# Patient Record
Sex: Female | Born: 1960 | Race: White | Smoking: Current every day smoker
Health system: Southern US, Community
[De-identification: ages and names within clinical notes are randomized; demographics above are authoritative.]

## PROBLEM LIST (undated history)

## (undated) DIAGNOSIS — F99 Mental disorder, not otherwise specified: Secondary | ICD-10-CM

## (undated) DIAGNOSIS — E119 Type 2 diabetes mellitus without complications: Secondary | ICD-10-CM

## (undated) DIAGNOSIS — J449 Chronic obstructive pulmonary disease, unspecified: Secondary | ICD-10-CM

## (undated) DIAGNOSIS — R0602 Shortness of breath: Secondary | ICD-10-CM

## (undated) HISTORY — PX: TRACHEOSTOMY: SUR1362

---

## 2003-06-27 ENCOUNTER — Other Ambulatory Visit: Payer: Self-pay

## 2004-01-03 ENCOUNTER — Emergency Department: Payer: Self-pay | Admitting: Emergency Medicine

## 2004-03-09 ENCOUNTER — Emergency Department: Payer: Self-pay | Admitting: Internal Medicine

## 2004-12-27 ENCOUNTER — Encounter: Payer: Self-pay | Admitting: Internal Medicine

## 2005-01-08 ENCOUNTER — Encounter: Payer: Self-pay | Admitting: Internal Medicine

## 2005-02-08 ENCOUNTER — Encounter: Payer: Self-pay | Admitting: Internal Medicine

## 2005-03-08 ENCOUNTER — Encounter: Payer: Self-pay | Admitting: Internal Medicine

## 2005-04-08 ENCOUNTER — Encounter: Payer: Self-pay | Admitting: Internal Medicine

## 2007-10-08 ENCOUNTER — Other Ambulatory Visit: Payer: Self-pay

## 2007-10-08 ENCOUNTER — Emergency Department: Payer: Self-pay | Admitting: Emergency Medicine

## 2008-12-08 ENCOUNTER — Ambulatory Visit: Payer: Self-pay | Admitting: Internal Medicine

## 2009-01-08 ENCOUNTER — Ambulatory Visit: Payer: Self-pay | Admitting: Internal Medicine

## 2009-01-12 ENCOUNTER — Ambulatory Visit: Payer: Self-pay | Admitting: Internal Medicine

## 2009-01-20 ENCOUNTER — Ambulatory Visit: Payer: Self-pay | Admitting: Internal Medicine

## 2009-02-08 ENCOUNTER — Ambulatory Visit: Payer: Self-pay | Admitting: Internal Medicine

## 2010-02-03 ENCOUNTER — Ambulatory Visit: Payer: Self-pay

## 2010-03-04 ENCOUNTER — Inpatient Hospital Stay: Payer: Self-pay | Admitting: Specialist

## 2010-03-16 ENCOUNTER — Ambulatory Visit (HOSPITAL_COMMUNITY)
Admission: RE | Admit: 2010-03-16 | Discharge: 2010-03-16 | Disposition: A | Discharge status: Home / Self Care | Payer: Medicare Other | Source: Ambulatory Visit | Attending: Cardiovascular Disease | Admitting: Cardiovascular Disease

## 2010-03-16 DIAGNOSIS — J4489 Other specified chronic obstructive pulmonary disease: Secondary | ICD-10-CM | POA: Insufficient documentation

## 2010-03-16 DIAGNOSIS — J449 Chronic obstructive pulmonary disease, unspecified: Secondary | ICD-10-CM | POA: Insufficient documentation

## 2010-05-16 ENCOUNTER — Ambulatory Visit: Payer: Self-pay

## 2010-06-14 ENCOUNTER — Inpatient Hospital Stay: Payer: Self-pay | Admitting: *Deleted

## 2010-06-26 ENCOUNTER — Emergency Department: Payer: Self-pay | Admitting: Emergency Medicine

## 2010-06-30 ENCOUNTER — Emergency Department: Payer: Self-pay | Admitting: Emergency Medicine

## 2010-08-23 ENCOUNTER — Emergency Department: Payer: Self-pay | Admitting: Emergency Medicine

## 2010-09-18 ENCOUNTER — Emergency Department: Payer: Self-pay | Admitting: Unknown Physician Specialty

## 2011-02-21 ENCOUNTER — Ambulatory Visit: Payer: Self-pay | Admitting: Internal Medicine

## 2011-07-09 ENCOUNTER — Ambulatory Visit: Payer: Self-pay | Admitting: Internal Medicine

## 2011-08-05 ENCOUNTER — Inpatient Hospital Stay: Payer: Self-pay | Admitting: Internal Medicine

## 2011-08-05 LAB — BASIC METABOLIC PANEL
Co2: 32 mmol/L (ref 21–32)
Creatinine: 0.61 mg/dL (ref 0.60–1.30)
EGFR (African American): >60
EGFR (Non-African Amer.): >60
Glucose: 119 mg/dL — ABNORMAL HIGH (ref 65–99)
Potassium: 3.9 mmol/L (ref 3.5–5.1)
Sodium: 140 mmol/L (ref 136–145)

## 2011-08-05 LAB — CBC
HCT: 47.1 % — ABNORMAL HIGH (ref 35.0–47.0)
HGB: 16.1 g/dL — ABNORMAL HIGH (ref 12.0–16.0)
MCV: 94 fL (ref 80–100)
RBC: 5.03 10*6/uL (ref 3.80–5.20)
RDW: 13.9 % (ref 11.5–14.5)
WBC: 8.4 10*3/uL (ref 3.6–11.0)

## 2011-08-06 LAB — CBC WITH DIFFERENTIAL/PLATELET
Basophil #: 0 10*3/uL (ref 0.0–0.1)
Eosinophil #: 0 10*3/uL (ref 0.0–0.7)
Eosinophil %: 0 %
HCT: 44.6 % (ref 35.0–47.0)
HGB: 14.8 g/dL (ref 12.0–16.0)
Lymphocyte #: 0.4 10*3/uL — ABNORMAL LOW (ref 1.0–3.6)
MCH: 31.4 pg (ref 26.0–34.0)
MCHC: 33.1 g/dL (ref 32.0–36.0)
MCV: 95 fL (ref 80–100)
Monocyte #: 0.5 x10 3/mm (ref 0.2–0.9)
Monocyte %: 4.5 %
Neutrophil #: 10.5 10*3/uL — ABNORMAL HIGH (ref 1.4–6.5)
RBC: 4.7 10*6/uL (ref 3.80–5.20)

## 2011-08-07 LAB — TROPONIN I: Troponin-I: 0.36 ng/mL — ABNORMAL HIGH

## 2011-08-07 LAB — CK: CK, Total: 34 U/L (ref 21–215)

## 2011-08-08 LAB — CK-MB: CK-MB: 2.1 ng/mL (ref 0.5–3.6)

## 2011-08-08 LAB — CREATININE, SERUM: EGFR (Non-African Amer.): >60

## 2011-08-08 LAB — CK: CK, Total: 31 U/L (ref 21–215)

## 2011-08-08 LAB — VANCOMYCIN, TROUGH: Vancomycin, Trough: 7 ug/mL — ABNORMAL LOW (ref 10–20)

## 2011-08-09 ENCOUNTER — Ambulatory Visit: Payer: Self-pay | Admitting: Internal Medicine

## 2011-08-09 LAB — BASIC METABOLIC PANEL
Anion Gap: 8 (ref 7–16)
BUN: 7 mg/dL (ref 7–18)
Co2: 33 mmol/L — ABNORMAL HIGH (ref 21–32)
Creatinine: 0.38 mg/dL — ABNORMAL LOW (ref 0.60–1.30)
EGFR (African American): >60
EGFR (Non-African Amer.): >60
Glucose: 137 mg/dL — ABNORMAL HIGH (ref 65–99)
Osmolality: 274 (ref 275–301)
Potassium: 3.6 mmol/L (ref 3.5–5.1)
Sodium: 137 mmol/L (ref 136–145)

## 2011-08-09 LAB — WBC: WBC: 9 10*3/uL (ref 3.6–11.0)

## 2011-08-10 ENCOUNTER — Inpatient Hospital Stay
Admission: AD | Admit: 2011-08-10 | Discharge: 2011-08-30 | Disposition: A | Discharge status: Home / Self Care | Payer: Medicare Other | Source: Ambulatory Visit | Attending: Internal Medicine | Admitting: Internal Medicine

## 2011-08-10 ENCOUNTER — Inpatient Hospital Stay (HOSPITAL_COMMUNITY): Admit: 2011-08-10 | Payer: Self-pay

## 2011-08-10 ENCOUNTER — Other Ambulatory Visit (HOSPITAL_COMMUNITY): Payer: Self-pay

## 2011-08-10 ENCOUNTER — Ambulatory Visit (HOSPITAL_COMMUNITY)
Admission: AD | Admit: 2011-08-10 | Discharge: 2011-08-10 | Disposition: A | Discharge status: Home / Self Care | Payer: Medicare Other | Source: Other Acute Inpatient Hospital | Attending: Internal Medicine | Admitting: Internal Medicine

## 2011-08-10 DIAGNOSIS — J441 Chronic obstructive pulmonary disease with (acute) exacerbation: Secondary | ICD-10-CM

## 2011-08-10 DIAGNOSIS — J9600 Acute respiratory failure, unspecified whether with hypoxia or hypercapnia: Secondary | ICD-10-CM | POA: Insufficient documentation

## 2011-08-10 DIAGNOSIS — Z930 Tracheostomy status: Secondary | ICD-10-CM

## 2011-08-10 DIAGNOSIS — J449 Chronic obstructive pulmonary disease, unspecified: Secondary | ICD-10-CM | POA: Insufficient documentation

## 2011-08-10 DIAGNOSIS — J9601 Acute respiratory failure with hypoxia: Secondary | ICD-10-CM

## 2011-08-10 DIAGNOSIS — J4489 Other specified chronic obstructive pulmonary disease: Secondary | ICD-10-CM | POA: Insufficient documentation

## 2011-08-10 LAB — COMPREHENSIVE METABOLIC PANEL
ALT: 11 U/L (ref 0–35)
AST: 13 U/L (ref 0–37)
CO2: 35 mEq/L — ABNORMAL HIGH (ref 19–32)
Calcium: 8.2 mg/dL — ABNORMAL LOW (ref 8.4–10.5)
Creatinine, Ser: 0.48 mg/dL — ABNORMAL LOW (ref 0.50–1.10)
GFR calc Af Amer: >90 mL/min (ref >90)
GFR calc non Af Amer: >90 mL/min (ref >90)
Glucose, Bld: 137 mg/dL — ABNORMAL HIGH (ref 70–99)
Sodium: 144 mEq/L (ref 135–145)
Total Protein: 5.5 g/dL — ABNORMAL LOW (ref 6.0–8.3)

## 2011-08-10 LAB — CBC WITH DIFFERENTIAL/PLATELET
Basophils Relative: 1 % (ref 0–1)
Eosinophils Relative: 0 % (ref 0–5)
HCT: 43.6 % (ref 36.0–46.0)
Hemoglobin: 14.3 g/dL (ref 12.0–15.0)
Lymphocytes Relative: 5 % — ABNORMAL LOW (ref 12–46)
MCHC: 32.8 g/dL (ref 30.0–36.0)
Neutro Abs: 6.8 10*3/uL (ref 1.7–7.7)
Neutrophils Relative %: 87 % — ABNORMAL HIGH (ref 43–77)
RBC: 4.58 MIL/uL (ref 3.87–5.11)

## 2011-08-10 LAB — URINALYSIS, ROUTINE W REFLEX MICROSCOPIC
Bilirubin Urine: NEGATIVE
Glucose, UA: NEGATIVE mg/dL
Specific Gravity, Urine: 1.011 (ref 1.005–1.030)
Urobilinogen, UA: 1 mg/dL (ref 0.0–1.0)
pH: 7.5 (ref 5.0–8.0)

## 2011-08-10 LAB — BLOOD GAS, ARTERIAL
Acid-Base Excess: 8.5 mmol/L — ABNORMAL HIGH (ref 0.0–2.0)
Bicarbonate: 33.9 mEq/L — ABNORMAL HIGH (ref 20.0–24.0)
FIO2: 0.4 %
RATE: 14 resp/min
pCO2 arterial: 60.9 mmHg (ref 35.0–45.0)
pO2, Arterial: 189 mmHg — ABNORMAL HIGH (ref 80.0–100.0)

## 2011-08-10 LAB — URINE MICROSCOPIC-ADD ON

## 2011-08-10 LAB — PHOSPHORUS: Phosphorus: 2.5 mg/dL (ref 2.5–4.9)

## 2011-08-10 LAB — MAGNESIUM: Magnesium: 2.2 mg/dL

## 2011-08-10 LAB — EXPECTORATED SPUTUM ASSESSMENT W GRAM STAIN, RFLX TO RESP C

## 2011-08-10 NOTE — Consult Note (Signed)
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 Treated at [facility] for resp failure, copd exacerbation. Treated with levo, steroids, nebs. Transferred to [facility] on vent . Called to assist. Reported pcxr without infiltrate but micor with acromabacter.  PMH: Copd, trach, stenosis, smoker, manic depressive, hallucination PSH;tracheostomy, chole, ovarian tumur  Allergies nkds  Family History copd Social History No job, disabled, lives at home  Review Of Systems:  As above, limited history provdied  Brief patient description:  51 yr old s/p trach with tracheal stenosis, transfer from [place] with copd exac, now on vent.  Events Since Admission: Vent dep  Current Status:  Vital Signs:  flow sheet  Physical Examination: General:  Awkae, no distress Neuro:  Nonfocal, anxious HEENT:  per Neck:  Trach 4, dry Cardiovascular:  s1 s2 rrr no r Lungs:  Wheezing bilateral moderate difuse insp, expr Abdomen:  Soft, BS wnl no r Skin:  No rash  Active Problems:  * No active hospital problems. *    ASSESSMENT AND PLAN  PULMONARY No results found for this basename: PHART:5,PCO2:5,PCO2ART:5,PO2ART:5,HCO3:5,O2SAT:5 in the last 168 hours Ventilator Settings:   CXR:  Chronic int changes Trach (chronic)  A:  COPD exac, acute resp failue, r/o PNA P:   pcxr reviewed, no defined infiltrate Increase solumedral Nebs alb, atrov Excellent neurostatus likely may allow to trach collar in am 4 hrs goal Gram neg may be colinizer Vent setting reviewed, abg now, may need increase peak flor rate May in fact need trach upsize to 6  ID Gram neg noted On levo, transition to Imipenem for now Follow pcxr for  development infiltrate May be able to dc all abx in 48 hrs if remains culture neg and progress resp wise Pct may be helpful daily x 3 days to dc abx  Will see Monday, attempt trach collar x 4 hrs goal daily overweekend   Nelda Bucks., M.D. Pulmonary and Critical Care Medicine [facility] Pager: [phone]  08/10/2011, 5:05 PM

## 2011-08-11 LAB — CARDIAC PANEL(CRET KIN+CKTOT+MB+TROPI)
CK, MB: 2.6 ng/mL (ref 0.3–4.0)
Total CK: 86 U/L (ref 7–177)
Troponin I: <0.3 ng/mL (ref <0.30)

## 2011-08-12 LAB — CBC
HCT: 42.8 % (ref 36.0–46.0)
Hemoglobin: 13.6 g/dL (ref 12.0–15.0)
MCV: 95.1 fL (ref 78.0–100.0)
RDW: 13.9 % (ref 11.5–15.5)
WBC: 9 10*3/uL (ref 4.0–10.5)

## 2011-08-12 LAB — BASIC METABOLIC PANEL
BUN: 13 mg/dL (ref 6–23)
Chloride: 100 mEq/L (ref 96–112)
Creatinine, Ser: 0.4 mg/dL — ABNORMAL LOW (ref 0.50–1.10)
GFR calc Af Amer: >90 mL/min (ref >90)
Glucose, Bld: 129 mg/dL — ABNORMAL HIGH (ref 70–99)

## 2011-08-13 LAB — BASIC METABOLIC PANEL
BUN: 13 mg/dL (ref 6–23)
Calcium: 8.6 mg/dL (ref 8.4–10.5)
Creatinine, Ser: 0.4 mg/dL — ABNORMAL LOW (ref 0.50–1.10)
GFR calc Af Amer: >90 mL/min (ref >90)
GFR calc non Af Amer: >90 mL/min (ref >90)
Glucose, Bld: 121 mg/dL — ABNORMAL HIGH (ref 70–99)
Potassium: 3.8 mEq/L (ref 3.5–5.1)

## 2011-08-13 LAB — CBC
HCT: 42.4 % (ref 36.0–46.0)
MCH: 30.9 pg (ref 26.0–34.0)
MCHC: 32.5 g/dL (ref 30.0–36.0)
MCV: 94.9 fL (ref 78.0–100.0)
RDW: 13.7 % (ref 11.5–15.5)

## 2011-08-13 LAB — CULTURE, RESPIRATORY W GRAM STAIN

## 2011-08-13 NOTE — Consult Note (Signed)
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 Treated at [facility] for resp failure, copd exacerbation. Treated with levo, steroids, nebs. Transferred to [facility] on vent . Called to assist. Reported pcxr without infiltrate but micor with acromabacter.  Vital Signs:  flow sheet  Physical Examination: General:  Awkae, no distress Neuro:  Nonfocal, anxious HEENT:  per Neck:  Trach 4, dry Cardiovascular:  s1 s2 rrr no r Lungs:  Wheezing bilateral moderate difuse insp, expr Abdomen:  Soft, BS wnl no r Skin:  No rash  Active Problems:  Acute respiratory failure with hypoxia  COPD exacerbation  Tracheostomy status   ASSESSMENT AND PLAN  PULMONARY  Lab 08/10/11 1750  PHART 7.365  PCO2ART 60.9*  PO2ART 189.0*  HCO3 33.9*  O2SAT 99.4   Ventilator Settings:   CXR:  Chronic int changes Trach (chronic)  A:  COPD exac, acute resp failue, r/o PNA P:   pcxr reviewed, no defined infiltrate Increase solumedral Nebs alb, atrov Excellent neurostatus likely may allow to trach collar in am 4 hrs goal Gram neg may be colinizer Vent setting reviewed, abg now, may need increase peak flor rate May in fact need trach upsize to 6 but keep weaning for now to see if will be able to decannulate prior to upsizing.  ID Gram neg noted On levo, transition to Imipenem for now Follow pcxr for development infiltrate May be able to dc all abx in 48 hrs if remains culture neg and progress resp wise Pct may be helpful daily x 3 days to dc abx  Alyson Reedy, M.D. [facility] Pulmonary/Critical Care Medicine. Pager: [phone]. After hours pager: [phone].

## 2011-08-14 ENCOUNTER — Inpatient Hospital Stay (HOSPITAL_COMMUNITY): Admit: 2011-08-14 | Payer: Self-pay

## 2011-08-16 LAB — CULTURE, BLOOD (ROUTINE X 2): Culture: NO GROWTH

## 2011-08-17 NOTE — Progress Notes (Signed)
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 Treated at [facility] for resp failure, copd exacerbation. Treated with levo, steroids, nebs. Transferred to [facility] on vent . Called to assist. Reported pcxr without infiltrate but micor with acromabacter.  Vital Signs:  flow sheet  Physical Examination: General:  Awkae, no distress Neuro:  Nonfocal, anxious HEENT:  per Neck:  Trach 4, dry Cardiovascular:  s1 s2 rrr no r Lungs:  Wheezing bilateral moderate difuse insp, expr Abdomen:  Soft, BS wnl no r Skin:  No rash  Active Problems:  Acute respiratory failure with hypoxia  COPD exacerbation  Tracheostomy status   ASSESSMENT AND PLAN  PULMONARY  Lab 08/10/11 1750  PHART 7.365  PCO2ART 60.9*  PO2ART 189.0*  HCO3 33.9*  O2SAT 99.4   Ventilator Settings:   CXR:  Chronic int changes Trach (chronic)  A:  COPD exac, acute resp failue, r/o PNA P:   pcxr reviewed, no defined infiltrate Increase solumedral Nebs alb, atrov Excellent neurostatus likely may allow to trach collar in am 4 hrs goal Gram neg may be colinizer Vent setting reviewed May in fact need trach upsize to 6 but keep weaning for now to see if will be able to decannulate prior to upsizing.  ID Gram neg noted Follow pcxr for development infiltrate Abx per primary.  Alyson Reedy, M.D. [facility] Pulmonary/Critical Care Medicine. Pager: [phone]. After hours pager: [phone].

## 2011-08-18 ENCOUNTER — Other Ambulatory Visit (HOSPITAL_COMMUNITY): Payer: Self-pay

## 2011-08-18 LAB — RENAL FUNCTION PANEL
Calcium: 8.6 mg/dL (ref 8.4–10.5)
Creatinine, Ser: 0.49 mg/dL — ABNORMAL LOW (ref 0.50–1.10)
GFR calc Af Amer: >90 mL/min (ref >90)
GFR calc non Af Amer: >90 mL/min (ref >90)
Phosphorus: 3.7 mg/dL (ref 2.3–4.6)
Sodium: 139 mEq/L (ref 135–145)

## 2011-08-18 LAB — CBC
MCH: 30.5 pg (ref 26.0–34.0)
MCHC: 32.9 g/dL (ref 30.0–36.0)
MCV: 92.8 fL (ref 78.0–100.0)
Platelets: 191 10*3/uL (ref 150–400)
RDW: 13.5 % (ref 11.5–15.5)

## 2011-08-19 LAB — CBC
Hemoglobin: 14.9 g/dL (ref 12.0–15.0)
MCH: 30.5 pg (ref 26.0–34.0)
Platelets: 199 10*3/uL (ref 150–400)
RBC: 4.89 MIL/uL (ref 3.87–5.11)
WBC: 15.3 10*3/uL — ABNORMAL HIGH (ref 4.0–10.5)

## 2011-08-20 LAB — URINE CULTURE
Colony Count: NO GROWTH
Culture: NO GROWTH

## 2011-08-20 NOTE — Progress Notes (Signed)
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 Treated at [facility] for resp failure, copd exacerbation. Treated with levo, steroids, nebs. Transferred to [facility] on vent . Called to assist. Reported pcxr without infiltrate but micor with acromabacter.  Vital Signs:  reviewed.   Subjective/Overnight:  Tol trach capped x 8 hours yesterday, not tol this am.  Very SOB, mild stridor.   Physical Examination: General:  Awkae, no distress Neuro:  Nonfocal, anxious HEENT:  #4 cuffed, capped Cardiovascular:  s1 s2 rrr no r Lungs: resps even, mildly labored, mild stridor with trach capped, resolved with uncapped, diminished bases, few exp wheeze  Abdomen:  Soft, BS wnl no r Skin:  No rash  Active Problems:  Acute respiratory failure with hypoxia  COPD exacerbation  Tracheostomy status   ASSESSMENT AND PLAN   CXR:  No new CXR  Trach (chronic)  A:  COPD exac, acute resp failue, r/o PNA P:   pcxr reviewed, no defined infiltrate Cont solumedrol  Nebs alb, atrov Intermittent CXR  Cont ATC as tol  Not tolerating trach cap this am, tolerated yesterday.  Retry 8/13.       ID Gram neg noted Follow pcxr for development infiltrate Abx per primary.   Danford Bad, NP 08/20/2011  10:33 AM Pager: [phone] or [phone]  Patient examined.  Records reviewed.  Case discussed with NP.  Assessment and plan as above.  Orlean Bradford, M.D., F.C.C.P. Pulmonary and Critical Care Medicine [facility] Cell: [phone] Pager: [phone]

## 2011-08-21 LAB — CBC
HCT: 41 % (ref 36.0–46.0)
Hemoglobin: 13.6 g/dL (ref 12.0–15.0)
MCV: 92.6 fL (ref 78.0–100.0)
WBC: 13.1 10*3/uL — ABNORMAL HIGH (ref 4.0–10.5)

## 2011-08-23 NOTE — Progress Notes (Signed)
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 Treated at [facility] for resp failure, copd exacerbation. Treated with levo, steroids, nebs. Transferred to [facility] on [facility] . Called to assist. Reported pcxr without infiltrate but micor with acromabacter.  Vital Signs:  Reviewed.   Subjective/Overnight: Called back by [facility] MD to further discuss ?decannulation.   Physical Examination: General:  Awkae, no distress Neuro:  Nonfocal, anxious HEENT:  #4 cuffed, capped Cardiovascular:  s1 s2 rrr no r Lungs: resps even, mildly labored, tachypnea, mild distress with trach capped, resolved with uncapped, diminished bases, few exp wheeze  Abdomen:  Soft, BS wnl no r Skin:  No rash  ASSESSMENT AND PLAN  COPD exacerbation Chronic tracheostomy Acute on chronic respiratory failure  -->  Solu-Medrol  -->  Albuterol / Atrovent -->  Trach collar as tolerated -->  Obtain old PFTs -->  Would not decannulate as not tolerating trach capped for any extended period of time (likely combination of tracheal stenosis, poor baseline lung function and impaired secretion clearance - need for frequent suction). Discussed with pt at length who very much wants trach out.    Danford Bad, NP 08/23/2011  1:27 PM Pager: [phone] or [phone]  Patient examined.  Records reviewed.  Case discussed with NP.  Assessment and plan as above.  Orlean Bradford, M.D., F.C.C.P. Pulmonary and Critical Care Medicine [facility] Cell: [phone] Pager: [phone]

## 2011-08-24 LAB — CULTURE, BLOOD (ROUTINE X 2): Culture: NO GROWTH

## 2011-08-24 LAB — BASIC METABOLIC PANEL
BUN: 6 mg/dL (ref 6–23)
CO2: 34 mEq/L — ABNORMAL HIGH (ref 19–32)
Chloride: 98 mEq/L (ref 96–112)
Creatinine, Ser: 0.45 mg/dL — ABNORMAL LOW (ref 0.50–1.10)
Glucose, Bld: 121 mg/dL — ABNORMAL HIGH (ref 70–99)
Potassium: 3.7 mEq/L (ref 3.5–5.1)

## 2011-08-24 LAB — CBC
HCT: 42.7 % (ref 36.0–46.0)
Hemoglobin: 13.8 g/dL (ref 12.0–15.0)
MCH: 30.7 pg (ref 26.0–34.0)
MCHC: 32.3 g/dL (ref 30.0–36.0)
MCV: 94.9 fL (ref 78.0–100.0)
RDW: 13.8 % (ref 11.5–15.5)

## 2011-08-24 NOTE — Progress Notes (Signed)
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 Treated at [facility] for resp failure, copd exacerbation. Treated with levo, steroids, nebs. Transferred to [facility] on vent . Called to assist. Reported pcxr without infiltrate but micro with acromabacter.  Vital Signs:  Reviewed.   Subjective/Overnight: No distress.   Physical Examination: General:  Awake, no distress Neuro:  Nonfocal, anxious HEENT:  #4 cuffed, capped w/ PMV Cardiovascular:  s1 s2 rrr no r Lungs: resps even, mildly labored, tachypnea, mild distress with trach capped, resolved with uncapped, diminished bases, few exp wheeze  Abdomen:  Soft, BS wnl no r Skin:  No rash  ASSESSMENT AND PLAN  COPD exacerbation Chronic tracheostomy - had been tolerating capping X 60 hours but had think secretions and could not clear them. Since that time has been on PMV and ATC at night. Of note has audible stridor when tach capped w/ occlusive cap. These are not present w/ PMV or trach open to ATC.  Acute on chronic respiratory failure  -->  Steroids per IM -->  Albuterol / Atrovent -->  Trach collar as tolerated -->  Obtain old PFTs -->  Would not decannulate as not tolerating trach capped for any extended period of time (likely combination of tracheal stenosis, poor baseline lung function and impaired secretion clearance - need for frequent suction). Discussed with pt at length once again who very much wants trach out.  She understands that this is not an option that is safe at this point.   BABCOCK,PETE, NP 08/24/2011  2:18 PM  Patient examined.  Records reviewed.  Case discussed with NP.  Assessment and plan edited as above.  Orlean Bradford, M.D.,  F.C.C.P. Pulmonary and Critical Care Medicine [facility] Cell: [phone] Pager: [phone]

## 2011-08-27 LAB — COMPREHENSIVE METABOLIC PANEL
AST: 12 U/L (ref 0–37)
CO2: 30 mEq/L (ref 19–32)
Calcium: 8.6 mg/dL (ref 8.4–10.5)
Creatinine, Ser: 0.4 mg/dL — ABNORMAL LOW (ref 0.50–1.10)
GFR calc Af Amer: >90 mL/min (ref >90)
GFR calc non Af Amer: >90 mL/min (ref >90)
Total Protein: 5.4 g/dL — ABNORMAL LOW (ref 6.0–8.3)

## 2011-08-27 LAB — CBC
MCH: 30.1 pg (ref 26.0–34.0)
MCHC: 32 g/dL (ref 30.0–36.0)
MCV: 94.2 fL (ref 78.0–100.0)
Platelets: 153 10*3/uL (ref 150–400)
RBC: 3.95 MIL/uL (ref 3.87–5.11)
RDW: 13.3 % (ref 11.5–15.5)

## 2011-08-27 NOTE — Progress Notes (Signed)
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 Treated at [facility] for resp failure, copd exacerbation. Treated with levo, steroids, nebs. Transferred to [facility] on vent . Called to assist. Reported pcxr without infiltrate but micro with acromabacter.  Vital Signs:  Reviewed.   Subjective/Overnight: No distress.   Physical Examination: General:  Awake, no distress Neuro:  Nonfocal, anxious HEENT:  #4 cuffed, capped w/ PMV Cardiovascular:  s1 s2 rrr no r Lungs: resps even, mildly labored, tachypnea, mild distress with trach capped, resolved with uncapped, diminished bases, few exp wheeze  Abdomen:  Soft, BS wnl no r Skin:  No rash  ASSESSMENT AND PLAN  COPD exacerbation Chronic tracheostomy  Acute on chronic respiratory failure  -->  Steroids per IM -->  Albuterol / Atrovent -->  Trach collar as tolerated -->  Would not decannulate as not tolerating trach capped for any extended period of time (likely combination of tracheal stenosis, poor baseline lung function and impaired secretion clearance - need for frequent suction).    Shan Levans Beeper  [phone]  Cell  [phone]  If no response or cell goes to voicemail, call beeper [phone]  08/27/2011  11:12 AM

## 2011-08-29 NOTE — Progress Notes (Signed)
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 Treated at [facility] for resp failure, copd exacerbation. Treated with levo, steroids, nebs. Transferred to [facility] on vent . Called to assist. Reported pcxr without infiltrate but micro with acromabacter.  Vital Signs:  Reviewed.   Subjective/Overnight: No distress.   Physical Examination: General:  Awake, no distress Neuro:  Nonfocal, anxious HEENT:  #4 cuffed, capped w/ PMV Cardiovascular:  s1 s2 rrr no r Lungs: resps even, mildly labored, tachypnea, mild distress with trach capped, resolved with uncapped, diminished bases, few exp wheeze  Abdomen:  Soft, BS wnl no r Skin:  No rash  ASSESSMENT AND PLAN  COPD exacerbation Chronic tracheostomy  Acute on chronic respiratory failure  -->  Steroids per IM -->  Albuterol / Atrovent -->  Trach collar as tolerated -->  Would not decannulate as not tolerating trach capped for any extended period of time (likely combination of tracheal stenosis, poor baseline lung function and impaired secretion clearance - need for frequent suction).   Apparently for d/c to home 8/22.  Her primary pulm MD is Nurse, learning disability in [place].  SHe should f/u with him . PCCM signing off . Call again prn.     Shan Levans Beeper  [phone]  Cell  [phone]  If no response or cell goes to voicemail, call beeper [phone]  08/29/2011  11:12 AM

## 2011-09-01 LAB — EXPECTORATED SPUTUM ASSESSMENT W GRAM STAIN, RFLX TO RESP C

## 2011-09-25 ENCOUNTER — Emergency Department: Payer: Self-pay | Admitting: Internal Medicine

## 2011-11-08 ENCOUNTER — Ambulatory Visit: Payer: Self-pay | Admitting: Physician Assistant

## 2011-11-19 ENCOUNTER — Inpatient Hospital Stay: Payer: Self-pay | Admitting: Internal Medicine

## 2011-11-19 LAB — BASIC METABOLIC PANEL
Anion Gap: 5 — ABNORMAL LOW (ref 7–16)
BUN: 11 mg/dL (ref 7–18)
Creatinine: 0.64 mg/dL (ref 0.60–1.30)
EGFR (African American): >60
EGFR (Non-African Amer.): >60
Glucose: 120 mg/dL — ABNORMAL HIGH (ref 65–99)
Sodium: 141 mmol/L (ref 136–145)

## 2011-11-19 LAB — CBC
HCT: 47.9 % — ABNORMAL HIGH (ref 35.0–47.0)
HGB: 16 g/dL (ref 12.0–16.0)
MCH: 31.7 pg (ref 26.0–34.0)
MCHC: 33.5 g/dL (ref 32.0–36.0)
MCV: 95 fL (ref 80–100)
Platelet: 237 10*3/uL (ref 150–440)
RBC: 5.06 10*6/uL (ref 3.80–5.20)
RDW: 14.9 % — ABNORMAL HIGH (ref 11.5–14.5)

## 2011-11-19 LAB — TROPONIN I: Troponin-I: <0.02 ng/mL

## 2011-11-20 LAB — CBC WITH DIFFERENTIAL/PLATELET
Basophil #: 0 10*3/uL (ref 0.0–0.1)
Eosinophil #: 0 10*3/uL (ref 0.0–0.7)
Eosinophil %: 0 %
HCT: 49.7 % — ABNORMAL HIGH (ref 35.0–47.0)
HGB: 16.3 g/dL — ABNORMAL HIGH (ref 12.0–16.0)
Lymphocyte #: 0.5 10*3/uL — ABNORMAL LOW (ref 1.0–3.6)
MCH: 31.2 pg (ref 26.0–34.0)
MCV: 95 fL (ref 80–100)
Monocyte #: 0.7 x10 3/mm (ref 0.2–0.9)
Monocyte %: 4.2 %
Neutrophil #: 14.7 10*3/uL — ABNORMAL HIGH (ref 1.4–6.5)
RBC: 5.22 10*6/uL — ABNORMAL HIGH (ref 3.80–5.20)
RDW: 14.7 % — ABNORMAL HIGH (ref 11.5–14.5)
WBC: 15.9 10*3/uL — ABNORMAL HIGH (ref 3.6–11.0)

## 2011-11-20 LAB — BASIC METABOLIC PANEL
BUN: 15 mg/dL (ref 7–18)
Chloride: 101 mmol/L (ref 98–107)
EGFR (African American): >60
EGFR (Non-African Amer.): >60
Glucose: 208 mg/dL — ABNORMAL HIGH (ref 65–99)
Osmolality: 281 (ref 275–301)
Potassium: 4.3 mmol/L (ref 3.5–5.1)
Sodium: 137 mmol/L (ref 136–145)

## 2011-11-21 LAB — CBC WITH DIFFERENTIAL/PLATELET
Basophil #: 0 10*3/uL (ref 0.0–0.1)
HCT: 46.4 % (ref 35.0–47.0)
Lymphocyte #: 0.5 10*3/uL — ABNORMAL LOW (ref 1.0–3.6)
MCH: 30.7 pg (ref 26.0–34.0)
MCV: 94 fL (ref 80–100)
Monocyte #: 0.3 x10 3/mm (ref 0.2–0.9)
Monocyte %: 2.6 %
Neutrophil #: 12.1 10*3/uL — ABNORMAL HIGH (ref 1.4–6.5)
Platelet: 196 10*3/uL (ref 150–440)
RDW: 14.9 % — ABNORMAL HIGH (ref 11.5–14.5)

## 2011-11-21 LAB — MAGNESIUM: Magnesium: 2.3 mg/dL

## 2011-11-21 LAB — PHOSPHORUS: Phosphorus: 3.6 mg/dL (ref 2.5–4.9)

## 2011-11-21 LAB — POTASSIUM: Potassium: 3.7 mmol/L (ref 3.5–5.1)

## 2011-11-23 LAB — BASIC METABOLIC PANEL
Anion Gap: 7 (ref 7–16)
BUN: 14 mg/dL (ref 7–18)
Calcium, Total: 8.1 mg/dL — ABNORMAL LOW (ref 8.5–10.1)
Chloride: 102 mmol/L (ref 98–107)
Co2: 30 mmol/L (ref 21–32)
Creatinine: 0.55 mg/dL — ABNORMAL LOW (ref 0.60–1.30)
EGFR (African American): >60

## 2011-11-23 LAB — CBC WITH DIFFERENTIAL/PLATELET
Basophil #: 0 10*3/uL (ref 0.0–0.1)
Eosinophil #: 0 10*3/uL (ref 0.0–0.7)
Eosinophil %: 0 %
HCT: 47.7 % — ABNORMAL HIGH (ref 35.0–47.0)
Lymphocyte %: 1.4 %
MCHC: 32.4 g/dL (ref 32.0–36.0)
Neutrophil #: 12.1 10*3/uL — ABNORMAL HIGH (ref 1.4–6.5)
Neutrophil %: 95.1 %
Platelet: 186 10*3/uL (ref 150–440)
RDW: 15.1 % — ABNORMAL HIGH (ref 11.5–14.5)

## 2011-11-24 LAB — CBC WITH DIFFERENTIAL/PLATELET
Basophil #: 0 10*3/uL (ref 0.0–0.1)
Eosinophil #: 0 10*3/uL (ref 0.0–0.7)
HCT: 45.6 % (ref 35.0–47.0)
MCH: 30.7 pg (ref 26.0–34.0)
MCV: 95 fL (ref 80–100)
Monocyte %: 3.7 %
Platelet: 214 10*3/uL (ref 150–440)
RBC: 4.81 10*6/uL (ref 3.80–5.20)

## 2011-11-24 LAB — CULTURE, BLOOD (SINGLE)

## 2011-12-09 ENCOUNTER — Ambulatory Visit: Payer: Self-pay | Admitting: Internal Medicine

## 2011-12-12 ENCOUNTER — Inpatient Hospital Stay: Payer: Self-pay | Admitting: Internal Medicine

## 2011-12-12 LAB — COMPREHENSIVE METABOLIC PANEL
BUN: 3 mg/dL — ABNORMAL LOW (ref 7–18)
Bilirubin,Total: 0.3 mg/dL (ref 0.2–1.0)
Chloride: 100 mmol/L (ref 98–107)
Co2: 33 mmol/L — ABNORMAL HIGH (ref 21–32)
Creatinine: 0.34 mg/dL — ABNORMAL LOW (ref 0.60–1.30)
EGFR (African American): >60
EGFR (Non-African Amer.): >60
Potassium: 4.5 mmol/L (ref 3.5–5.1)
SGOT(AST): 25 U/L (ref 15–37)
SGPT (ALT): 16 U/L (ref 12–78)

## 2011-12-12 LAB — CBC WITH DIFFERENTIAL/PLATELET
Basophil %: 1.4 %
Eosinophil #: 0.1 10*3/uL (ref 0.0–0.7)
HCT: 42.1 % (ref 35.0–47.0)
HGB: 13.7 g/dL (ref 12.0–16.0)
Lymphocyte #: 1.7 10*3/uL (ref 1.0–3.6)
Lymphocyte %: 20.4 %
MCH: 30.4 pg (ref 26.0–34.0)
Monocyte #: 0.5 x10 3/mm (ref 0.2–0.9)
Monocyte %: 6.2 %
Neutrophil #: 5.9 10*3/uL (ref 1.4–6.5)
Neutrophil %: 70.3 %
Platelet: 323 10*3/uL (ref 150–440)
RBC: 4.49 10*6/uL (ref 3.80–5.20)
WBC: 8.5 10*3/uL (ref 3.6–11.0)

## 2011-12-13 LAB — CBC WITH DIFFERENTIAL/PLATELET
Basophil %: 0.4 %
Eosinophil #: 0 10*3/uL (ref 0.0–0.7)
Eosinophil %: 0 %
HCT: 43.4 % (ref 35.0–47.0)
HGB: 14.2 g/dL (ref 12.0–16.0)
MCH: 30.6 pg (ref 26.0–34.0)
MCHC: 32.7 g/dL (ref 32.0–36.0)
MCV: 94 fL (ref 80–100)
Monocyte #: 0.1 x10 3/mm — ABNORMAL LOW (ref 0.2–0.9)
Monocyte %: 1 %
Neutrophil #: 5.7 10*3/uL (ref 1.4–6.5)
Neutrophil %: 91.9 %
RBC: 4.63 10*6/uL (ref 3.80–5.20)

## 2011-12-13 LAB — BASIC METABOLIC PANEL
Anion Gap: 3 — ABNORMAL LOW (ref 7–16)
BUN: 6 mg/dL — ABNORMAL LOW (ref 7–18)
Chloride: 102 mmol/L (ref 98–107)
Co2: 32 mmol/L (ref 21–32)
Creatinine: 0.59 mg/dL — ABNORMAL LOW (ref 0.60–1.30)
EGFR (African American): >60
Osmolality: 275 (ref 275–301)

## 2011-12-13 LAB — DRUG SCREEN, URINE
Amphetamines, Ur Screen: NEGATIVE (ref ?–1000)
Benzodiazepine, Ur Scrn: NEGATIVE (ref ?–200)
Cocaine Metabolite,Ur ~~LOC~~: NEGATIVE (ref ?–300)
MDMA (Ecstasy)Ur Screen: NEGATIVE (ref ?–500)
Opiate, Ur Screen: NEGATIVE (ref ?–300)
Tricyclic, Ur Screen: NEGATIVE (ref ?–1000)

## 2011-12-13 LAB — MAGNESIUM: Magnesium: 1.9 mg/dL

## 2011-12-17 LAB — CULTURE, BLOOD (SINGLE)

## 2011-12-19 ENCOUNTER — Inpatient Hospital Stay: Payer: Self-pay | Admitting: Internal Medicine

## 2011-12-19 LAB — COMPREHENSIVE METABOLIC PANEL
Albumin: 3.4 g/dL (ref 3.4–5.0)
Alkaline Phosphatase: 150 U/L — ABNORMAL HIGH (ref 50–136)
BUN: 6 mg/dL — ABNORMAL LOW (ref 7–18)
Bilirubin,Total: 0.2 mg/dL (ref 0.2–1.0)
Chloride: 94 mmol/L — ABNORMAL LOW (ref 98–107)
Creatinine: 0.52 mg/dL — ABNORMAL LOW (ref 0.60–1.30)
EGFR (African American): >60
Glucose: 175 mg/dL — ABNORMAL HIGH (ref 65–99)
Osmolality: 274 (ref 275–301)
Potassium: 3.4 mmol/L — ABNORMAL LOW (ref 3.5–5.1)
SGPT (ALT): 20 U/L (ref 12–78)
Sodium: 136 mmol/L (ref 136–145)
Total Protein: 6.5 g/dL (ref 6.4–8.2)

## 2011-12-19 LAB — CBC
MCH: 31.3 pg (ref 26.0–34.0)
MCHC: 33 g/dL (ref 32.0–36.0)
Platelet: 266 10*3/uL (ref 150–440)
RDW: 13.9 % (ref 11.5–14.5)
WBC: 10.9 10*3/uL (ref 3.6–11.0)

## 2011-12-19 LAB — TROPONIN I: Troponin-I: <0.02 ng/mL

## 2011-12-19 LAB — PROTIME-INR: INR: 0.8

## 2011-12-21 LAB — BASIC METABOLIC PANEL
Anion Gap: 9 (ref 7–16)
BUN: 15 mg/dL (ref 7–18)
Creatinine: 0.73 mg/dL (ref 0.60–1.30)
EGFR (African American): >60
EGFR (Non-African Amer.): >60
Glucose: 167 mg/dL — ABNORMAL HIGH (ref 65–99)
Sodium: 138 mmol/L (ref 136–145)

## 2011-12-22 LAB — PHOSPHORUS: Phosphorus: 4.6 mg/dL (ref 2.5–4.9)

## 2011-12-22 LAB — MAGNESIUM: Magnesium: 2.3 mg/dL

## 2011-12-24 LAB — BASIC METABOLIC PANEL
Calcium, Total: 8 mg/dL — ABNORMAL LOW (ref 8.5–10.1)
Chloride: 99 mmol/L (ref 98–107)
Co2: 35 mmol/L — ABNORMAL HIGH (ref 21–32)
EGFR (Non-African Amer.): >60
Osmolality: 287 (ref 275–301)
Potassium: 3.7 mmol/L (ref 3.5–5.1)
Sodium: 139 mmol/L (ref 136–145)

## 2011-12-24 LAB — HEMOGLOBIN A1C: Hemoglobin A1C: 6.8 % — ABNORMAL HIGH (ref 4.2–6.3)

## 2011-12-25 LAB — CBC WITH DIFFERENTIAL/PLATELET
Basophil %: 0.1 %
Eosinophil #: 0 10*3/uL (ref 0.0–0.7)
Eosinophil %: 0 %
HGB: 12.7 g/dL (ref 12.0–16.0)
Lymphocyte %: 4.1 %
MCHC: 31.6 g/dL — ABNORMAL LOW (ref 32.0–36.0)
Monocyte %: 3.5 %
Neutrophil %: 92.3 %
RBC: 4.27 10*6/uL (ref 3.80–5.20)
WBC: 17 10*3/uL — ABNORMAL HIGH (ref 3.6–11.0)

## 2011-12-25 LAB — BASIC METABOLIC PANEL
Anion Gap: 3 — ABNORMAL LOW (ref 7–16)
BUN: 26 mg/dL — ABNORMAL HIGH (ref 7–18)
Chloride: 100 mmol/L (ref 98–107)
Creatinine: 0.44 mg/dL — ABNORMAL LOW (ref 0.60–1.30)
EGFR (African American): >60
Glucose: 195 mg/dL — ABNORMAL HIGH (ref 65–99)
Osmolality: 288 (ref 275–301)
Sodium: 139 mmol/L (ref 136–145)

## 2011-12-26 ENCOUNTER — Other Ambulatory Visit (HOSPITAL_COMMUNITY): Payer: Self-pay

## 2011-12-26 ENCOUNTER — Ambulatory Visit (HOSPITAL_COMMUNITY)
Admission: EM | Admit: 2011-12-26 | Discharge: 2011-12-26 | Disposition: A | Discharge status: Home / Self Care | Payer: Medicare Other | Source: Other Acute Inpatient Hospital | Attending: Internal Medicine | Admitting: Internal Medicine

## 2011-12-26 ENCOUNTER — Inpatient Hospital Stay
Admission: AD | Admit: 2011-12-26 | Discharge: 2012-01-10 | Disposition: A | Discharge status: Home / Self Care | Payer: Medicare Other | Source: Ambulatory Visit | Attending: Internal Medicine | Admitting: Internal Medicine

## 2011-12-26 DIAGNOSIS — Z930 Tracheostomy status: Secondary | ICD-10-CM

## 2011-12-26 DIAGNOSIS — J9620 Acute and chronic respiratory failure, unspecified whether with hypoxia or hypercapnia: Secondary | ICD-10-CM | POA: Diagnosis present

## 2011-12-26 DIAGNOSIS — J9600 Acute respiratory failure, unspecified whether with hypoxia or hypercapnia: Secondary | ICD-10-CM | POA: Insufficient documentation

## 2011-12-26 DIAGNOSIS — F319 Bipolar disorder, unspecified: Secondary | ICD-10-CM | POA: Diagnosis present

## 2011-12-26 DIAGNOSIS — R29898 Other symptoms and signs involving the musculoskeletal system: Secondary | ICD-10-CM

## 2011-12-26 DIAGNOSIS — J449 Chronic obstructive pulmonary disease, unspecified: Secondary | ICD-10-CM | POA: Diagnosis present

## 2011-12-26 DIAGNOSIS — E119 Type 2 diabetes mellitus without complications: Secondary | ICD-10-CM | POA: Diagnosis present

## 2011-12-26 LAB — BLOOD GAS, ARTERIAL
Bicarbonate: 37.5 mEq/L — ABNORMAL HIGH (ref 20.0–24.0)
FIO2: 0.4 %
MECHVT: 500 mL
O2 Saturation: 95.8 %
PEEP: 5 cmH2O
Patient temperature: 98.9
TCO2: 39.4 mmol/L (ref 0–100)

## 2011-12-26 LAB — CBC WITH DIFFERENTIAL/PLATELET
Basophil #: 0 10*3/uL (ref 0.0–0.1)
Basophil %: 0.1 %
Eosinophil #: 0 10*3/uL (ref 0.0–0.7)
HCT: 39.4 % (ref 35.0–47.0)
Lymphocyte #: 0.4 10*3/uL — ABNORMAL LOW (ref 1.0–3.6)
Lymphocyte %: 2.5 %
MCH: 29.8 pg (ref 26.0–34.0)
MCHC: 31.8 g/dL — ABNORMAL LOW (ref 32.0–36.0)
MCV: 94 fL (ref 80–100)
Monocyte #: 0.4 x10 3/mm (ref 0.2–0.9)
Neutrophil #: 16.6 10*3/uL — ABNORMAL HIGH (ref 1.4–6.5)
RDW: 14 % (ref 11.5–14.5)

## 2011-12-26 LAB — BASIC METABOLIC PANEL
BUN: 25 mg/dL — ABNORMAL HIGH (ref 7–18)
Calcium, Total: 7.9 mg/dL — ABNORMAL LOW (ref 8.5–10.1)
Co2: 35 mmol/L — ABNORMAL HIGH (ref 21–32)
EGFR (Non-African Amer.): >60
Osmolality: 289 (ref 275–301)
Potassium: 4.1 mmol/L (ref 3.5–5.1)
Sodium: 138 mmol/L (ref 136–145)

## 2011-12-27 ENCOUNTER — Other Ambulatory Visit (HOSPITAL_COMMUNITY): Payer: Self-pay

## 2011-12-27 DIAGNOSIS — Z930 Tracheostomy status: Secondary | ICD-10-CM

## 2011-12-27 DIAGNOSIS — M6250 Muscle wasting and atrophy, not elsewhere classified, unspecified site: Secondary | ICD-10-CM

## 2011-12-27 DIAGNOSIS — F319 Bipolar disorder, unspecified: Secondary | ICD-10-CM | POA: Diagnosis present

## 2011-12-27 DIAGNOSIS — E119 Type 2 diabetes mellitus without complications: Secondary | ICD-10-CM | POA: Diagnosis present

## 2011-12-27 DIAGNOSIS — J9620 Acute and chronic respiratory failure, unspecified whether with hypoxia or hypercapnia: Secondary | ICD-10-CM | POA: Diagnosis present

## 2011-12-27 DIAGNOSIS — J449 Chronic obstructive pulmonary disease, unspecified: Secondary | ICD-10-CM

## 2011-12-27 DIAGNOSIS — R29898 Other symptoms and signs involving the musculoskeletal system: Secondary | ICD-10-CM | POA: Diagnosis present

## 2011-12-27 LAB — BASIC METABOLIC PANEL
BUN: 27 mg/dL — ABNORMAL HIGH (ref 6–23)
CO2: 38 mEq/L — ABNORMAL HIGH (ref 19–32)
Calcium: 8.5 mg/dL (ref 8.4–10.5)
Glucose, Bld: 164 mg/dL — ABNORMAL HIGH (ref 70–99)
Sodium: 141 mEq/L (ref 135–145)

## 2011-12-27 LAB — CBC
HCT: 39.6 % (ref 36.0–46.0)
Hemoglobin: 12.3 g/dL (ref 12.0–15.0)
MCH: 29.5 pg (ref 26.0–34.0)
MCV: 95 fL (ref 78.0–100.0)
RBC: 4.17 MIL/uL (ref 3.87–5.11)

## 2011-12-27 LAB — MAGNESIUM: Magnesium: 2.2 mg/dL (ref 1.5–2.5)

## 2011-12-27 NOTE — Consult Note (Signed)
PULMONARY/CCM CONSULT NOTE  Requesting MD/Service: [facility] Date of admission: 12/18 Date of consult: 12/19 Reason for consultation: Vent mgmt   Pt Profile:  Severe COPD with recent prolonged vent dependence admitted in transfer from [place] 12/18 after recurrent VDRF 12/11. Profoundly deconditioned  Patient Active Problem List  Diagnosis  . Acute respiratory failure with hypoxia  . COPD exacerbation  . Tracheostomy status  . Acute-on-chronic respiratory failure  . COPD, severe  . Diabetes, type 2  . Bipolar disorder  . Muscular deconditioning       HPI: Admitted in transfer from [facility] after acute on chronic respiratory failure due to acute exacerbation of COPD requiring ventilator support. She was initially admitted to [facility] 12/11. She recently endured a prolonged hospitalization for same and has been admitted to [facility] previously this year  PMH: End stage COPD, tracheostomy dependent Bipolar disorder DMII      MEDICATIONS: reviewed  SH: continued to smoke up until her hospitalization @ [facility]. Markedly limited @ baseline due to dyspnea. Disabled  No family history on file.  ROS - N/A  There were no vitals filed for this visit.  EXAM:  Gen: appears older than stated age. RASS 0 HEENT: WNL Neck: No JVD Lungs: diminished throughout, few distant wheezes Cardiovascular: RRR s M Abdomen: NABS, soft, NT Musculoskeletal: B foot drop, severe LE muscle wasting, No edema Neuro: No focal deficits   DATA:  Labs reviewed  CXR: Hyperinflated, NAD, trach tube noted, L IJ CVL  IMPRESSION:   Principal Problem:  *Acute-on-chronic respiratory failure Active Problems:  Tracheostomy status  COPD, severe  Muscular deconditioning  Diabetes, type 2  Bipolar disorder Bilateral foot drop   DISCUSSION: She seems much more comfortable in PS mode and is able to pull excellent tidal volumes with minimal support. She appears ready for ATC trials. 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 After PIV established) -Suggest foot drop boots -aggressive PT -Mgmt of DM and psych issues per primary team -Nutritional support per primary team  PCCM will see TIW   Billy Fischer, MD ; [facility] service Mobile [phone].  After 5:30 PM or weekends, call [phone]

## 2011-12-28 NOTE — Progress Notes (Signed)
PULMONARY/CCM CONSULT NOTE  Requesting MD/Service: [facility] Date of admission: 12/18 Date of consult: 12/19 Reason for consultation: Vent mgmt   Pt Profile:  Severe COPD with recent prolonged vent dependence admitted in transfer from [place] 12/18 after recurrent VDRF 12/11. Profoundly deconditioned   There were no vitals filed for this visit. Afebrile, vss sat 89%  EXAM:  Gen: appears older than stated age. RASS 0 HEENT: WNL Neck: No JVD, trach unremarkable, #4 cuffed Lungs: diminished throughout, few distant wheezes Cardiovascular: RRR s M Abdomen: NABS, soft, NT Musculoskeletal: B foot drop, severe LE muscle wasting, No edema Neuro: No focal deficits   DATA:  Labs reviewed  CXR: Hyperinflated, NAD, trach tube noted, L IJ CVL  IMPRESSION:   Principal Problem:  *Acute-on-chronic respiratory failure Active Problems:  Tracheostomy status  COPD, severe  Diabetes, type 2  Bipolar disorder  Muscular deconditioning Bilateral foot drop   DISCUSSION: She seems much more comfortable in PS mode and is able to pull excellent tidal volumes with minimal support. She appears ready for ATC trials. 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 has secondary adrenal insuff given her recent history so would not stop altogether) -Agree with current nebulized steroids and BDs -She will not be a candidate for decannulation until she can spend a prolonged period of time of vent -Suggest foot drop boots -aggressive PT -Mgmt of DM and psych issues per primary team -Nutritional support per primary team  PCCM will see Twice a  week    PETE BABCOCK, NP 12/28/11 at 9:26am  STAFF NOTE: I, Dr Lavinia Sharps have personally reviewed patient's available data, including medical history, events of note, physical examination and test results as part of my evaluation. I have discussed with resident/NP and other care providers such as pharmacist, RN and RRT.  In addition,  I personally evaluated patient and elicited key findings of chroonic resp failure and s/p trach due to copd. She should not be decannulated.   Rest per NP/medical resident whose note is outlined above and that I agree with    Dr. Kalman Shan, M.D., [facility].C.P Pulmonary and Critical Care Medicine Staff Physician [facility]  Pulmonary and Critical Care Pager: [phone], If no answer or between  15:00h - 7:00h: call [phone]  12/28/2011 12:11 PM

## 2011-12-29 ENCOUNTER — Other Ambulatory Visit (HOSPITAL_COMMUNITY): Payer: Self-pay

## 2011-12-29 LAB — BLOOD GAS, ARTERIAL
MECHVT: 500 mL
PEEP: 5 cmH2O
Patient temperature: 98.9
pH, Arterial: 7.429 (ref 7.350–7.450)

## 2011-12-30 ENCOUNTER — Other Ambulatory Visit (HOSPITAL_COMMUNITY): Payer: Self-pay

## 2011-12-30 LAB — BASIC METABOLIC PANEL
GFR calc Af Amer: >90 mL/min (ref >90)
GFR calc non Af Amer: >90 mL/min (ref >90)
Potassium: 2.9 mEq/L — ABNORMAL LOW (ref 3.5–5.1)
Sodium: 139 mEq/L (ref 135–145)

## 2011-12-30 LAB — CBC
MCHC: 31.3 g/dL (ref 30.0–36.0)
RDW: 13.7 % (ref 11.5–15.5)
WBC: 14 10*3/uL — ABNORMAL HIGH (ref 4.0–10.5)

## 2011-12-31 LAB — BASIC METABOLIC PANEL
CO2: 39 mEq/L — ABNORMAL HIGH (ref 19–32)
Chloride: 95 mEq/L — ABNORMAL LOW (ref 96–112)
GFR calc non Af Amer: >90 mL/min (ref >90)
Glucose, Bld: 189 mg/dL — ABNORMAL HIGH (ref 70–99)
Potassium: 4 mEq/L (ref 3.5–5.1)
Sodium: 138 mEq/L (ref 135–145)

## 2011-12-31 LAB — CBC
Hemoglobin: 11.8 g/dL — ABNORMAL LOW (ref 12.0–15.0)
RBC: 3.95 MIL/uL (ref 3.87–5.11)
WBC: 15.9 10*3/uL — ABNORMAL HIGH (ref 4.0–10.5)

## 2011-12-31 NOTE — Progress Notes (Signed)
PULMONARY/CCM  NOTE  Requesting MD/Service: [facility] Date of admission: 12/18 Date of consult: 12/19 Reason for consultation: Vent mgmt   Pt Profile:  Severe COPD with recent prolonged vent dependence admitted in transfer from [place] 12/18 after recurrent VDRF 12/11. Profoundly deconditioned  Afebrile, vss sat 89%  EXAM:  Gen: Using some accessory muscles HEENT: trach site clean Lungs: diminished throughout, no wheeze Cardiovascular: s1s2 no murmur Abdomen: soft, NT Musculoskeletal: B foot drop, severe LE muscle wasting, No edema Neuro: No focal deficits   DATA:   Lab 12/31/11 0440 12/30/11 0500 12/27/11 0410  NA 138 139 141  K 4.0 2.9* 3.7  CL 95* 90* 97  CO2 39* 43* 38*  BUN 17 17 27*  CREATININE 0.44* 0.44* 0.38*  GLUCOSE 189* 221* 164*    Lab 12/31/11 0440 12/30/11 0500 12/27/11 0410  HGB 11.8* 12.1 12.3  HCT 37.1 38.7 39.6  WBC 15.9* 14.0* 22.7*  PLT 155 163 173    Dg Chest Port 1 View  12/30/2011  *RADIOLOGY REPORT*  Clinical Data: Respiratory failure  PORTABLE CHEST - 1 VIEW  Comparison: 12/29/2011; 12/27/2011; 08/18/2011  Findings:  Grossly unchanged cardiac silhouette and mediastinal contours. Stable position of support apparatus.  The lungs remain hyperinflated with flattening of bilateral hemidiaphragms.  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  Postsurgical change of the right lung apex.  No pneumothorax.  There is unchanged mild diffuse thickening of the pulmonary interstitium.  No new focal airspace opacity.  No pleural effusion.  Unchanged bones.  IMPRESSION: Hyperexpanded lungs and chronic bronchitic change without acute cardiopulmonary disease.   Original Report Authenticated By: Tacey Ruiz, MD      IMPRESSION:   Principal Problem:  *Acute-on-chronic respiratory failure Active Problems:  Tracheostomy status  COPD, severe  Diabetes, type 2  Bipolar disorder  Muscular deconditioning Bilateral foot drop   PLAN:  -ATC trials as  tolerated per protocol. 12-23 on 60% TC 24 hours -check c x r  PCCM will f/u 12/26.  Call if help needed sooner  [facility] Minor ACNP Adolph Pollack PCCM Pager [phone] till 3 pm If no answer page [phone] 12/31/2011, 12:07 PM  Reviewed above, examined pt, and agree with assessment/plan.   _______________ Coralyn Helling, MD [facility] Pulmonary/Critical Care 12/31/2011, 12:16 PM Pager:  [phone] After 3pm call: [phone]

## 2012-01-03 ENCOUNTER — Other Ambulatory Visit (HOSPITAL_COMMUNITY): Payer: Self-pay

## 2012-01-03 LAB — CBC
Hemoglobin: 12.8 g/dL (ref 12.0–15.0)
MCH: 29.7 pg (ref 26.0–34.0)
RBC: 4.31 MIL/uL (ref 3.87–5.11)

## 2012-01-03 LAB — BASIC METABOLIC PANEL
CO2: 37 mEq/L — ABNORMAL HIGH (ref 19–32)
Calcium: 9.1 mg/dL (ref 8.4–10.5)
Glucose, Bld: 297 mg/dL — ABNORMAL HIGH (ref 70–99)
Sodium: 138 mEq/L (ref 135–145)

## 2012-01-03 NOTE — Progress Notes (Signed)
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 Profoundly deconditioned  Subjective: Has intermittent cough w/o much secretions.  Denies wheeze or chest tightness.  Denies hx of OSA.  No issues with swallowing.  Objective: Vitals reviewed in bedside chart.   EXAM:  Gen: No distress HEENT: trach site clean Lungs: diminished throughout, no wheeze Cardiovascular: s1s2 no murmur Abdomen: soft, NT Musculoskeletal: no edema Neuro: Normal strength  DATA:   Lab 01/03/12 0525 12/31/11 0440 12/30/11 0500  NA 138 138 139  K 3.2* 4.0 2.9*  CL 93* 95* 90*  CO2 37* 39* 43*  BUN 11 17 17   CREATININE 0.43* 0.44* 0.44*  GLUCOSE 297* 189* 221*    Lab 01/03/12 0525 12/31/11 0440 12/30/11 0500  HGB 12.8 11.8* 12.1  HCT 40.0 37.1 38.7  WBC 18.0* 15.9* 14.0*  PLT 205 155 163    Dg Chest Port 1 View  01/03/2012  *RADIOLOGY REPORT*  Clinical Data: Pneumonia.  Tracheostomy.  PORTABLE CHEST - 1 VIEW  Comparison: Chest x-[place] 12/30/2011.  Findings: A tracheostomy tube is in place with tip 7.8 cm above the carina. There is a left-sided internal jugular central venous catheter with tip terminating in the mid superior vena cava. Suture line related to prior wedge resection in the right apex is unchanged.  Mild diffuse interstitial prominence and peribronchial cuffing, most pronounced throughout the mid and lower lungs bilaterally, slightly increased compared to the prior examination. In the upper lungs, particularly in the right apex, there are diminished vascular markings, suggestive of underlying emphysema. In the right apex in particular, there appears to be a large bulla. No definite pleural effusions.  No evidence of pulmonary edema. Heart size is normal.  Mediastinal contours are  unremarkable. Atherosclerosis of the thoracic aorta.  IMPRESSION: 1.  Support apparatus, as above. 2.  Interstitial prominence and peribronchial cuffing throughout the mid and lower lungs bilaterally is slightly increased compared to the prior study, and could be concerning for developing bronchopneumonia. 3.  Atherosclerosis.   Original Report Authenticated By: Trudie Reed, M.D.      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 I am not sure why decannulation attempts have not been tried previously. Plan: Will cap trach >> and if stable then possibly decannulate in next few days Adjust oxygen to keep SpO2 > 90% Continue bronchodilators   _______________ Coralyn Helling, MD [facility] Pulmonary/Critical Care 01/03/2012, 8:42 AM Pager:  [phone] After 3pm call: [phone]

## 2012-01-04 LAB — CBC
Hemoglobin: 13.7 g/dL (ref 12.0–15.0)
MCH: 29.8 pg (ref 26.0–34.0)
MCV: 92.6 fL (ref 78.0–100.0)
Platelets: 228 10*3/uL (ref 150–400)
RBC: 4.59 MIL/uL (ref 3.87–5.11)

## 2012-01-04 LAB — BASIC METABOLIC PANEL
BUN: 11 mg/dL (ref 6–23)
CO2: 34 mEq/L — ABNORMAL HIGH (ref 19–32)
Calcium: 9.4 mg/dL (ref 8.4–10.5)
Creatinine, Ser: 0.43 mg/dL — ABNORMAL LOW (ref 0.50–1.10)
Glucose, Bld: 328 mg/dL — ABNORMAL HIGH (ref 70–99)

## 2012-01-04 NOTE — Progress Notes (Signed)
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 Profoundly deconditioned  Subjective: Unable to tolerate trach cap 12/26 >> developed stridor immediately.  Objective: Vitals reviewed in bedside chart.   EXAM:  Gen: No distress HEENT: trach site clean Lungs: diminished throughout, no wheeze Cardiovascular: s1s2 no murmur Abdomen: soft, NT Musculoskeletal: no edema Neuro: Normal strength  DATA:   Lab 01/03/12 0525 12/31/11 0440 12/30/11 0500  NA 138 138 139  K 3.2* 4.0 2.9*  CL 93* 95* 90*  CO2 37* 39* 43*  BUN 11 17 17   CREATININE 0.43* 0.44* 0.44*  GLUCOSE 297* 189* 221*    Lab 01/04/12 0925 01/03/12 0525 12/31/11 0440  HGB 13.7 12.8 11.8*  HCT 42.5 40.0 37.1  WBC 18.9* 18.0* 15.9*  PLT 228 205 155    Dg Chest Port 1 View  01/03/2012  *RADIOLOGY REPORT*  Clinical Data: Pneumonia.  Tracheostomy.  PORTABLE CHEST - 1 VIEW  Comparison: Chest x-[place] 12/30/2011.  Findings: A tracheostomy tube is in place with tip 7.8 cm above the carina. There is a left-sided internal jugular central venous catheter with tip terminating in the mid superior vena cava. Suture line related to prior wedge resection in the right apex is unchanged.  Mild diffuse interstitial prominence and peribronchial cuffing, most pronounced throughout the mid and lower lungs bilaterally, slightly increased compared to the prior examination. In the upper lungs, particularly in the right apex, there are diminished vascular markings, suggestive of underlying emphysema. In the right apex in particular, there appears to be a large bulla. No definite pleural effusions.  No evidence of pulmonary edema. Heart size is normal.  Mediastinal contours are unremarkable. Atherosclerosis of the thoracic aorta.   IMPRESSION: 1.  Support apparatus, as above. 2.  Interstitial prominence and peribronchial cuffing throughout the mid and lower lungs bilaterally is slightly increased compared to the prior study, and could be concerning for developing bronchopneumonia. 3.  Atherosclerosis.   Original Report Authenticated By: Trudie Reed, M.D.      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 Plan: Will need ENT evaluation to assess for upper airway obstruction prior to reconsidering attempts at decannulation Adjust oxygen to keep SpO2 > 90% Continue bronchodilators D/c solumedrol Change to prednisone 30 mg daily, and wean off as tolerated  No active pulmonary issues.  PCCM will sign off.  Please call if help needed.   _______________ Coralyn Helling, MD [place] Pulmonary/Critical Care 01/04/2012, 11:05 AM Pager:  [phone] After 3pm call: [phone]

## 2012-01-09 ENCOUNTER — Ambulatory Visit: Payer: Self-pay | Admitting: Internal Medicine

## 2012-01-14 ENCOUNTER — Other Ambulatory Visit: Payer: Self-pay

## 2012-01-14 ENCOUNTER — Inpatient Hospital Stay (HOSPITAL_COMMUNITY): Payer: Medicare Other

## 2012-01-14 ENCOUNTER — Encounter (HOSPITAL_COMMUNITY): Payer: Self-pay | Admitting: *Deleted

## 2012-01-14 ENCOUNTER — Inpatient Hospital Stay (HOSPITAL_COMMUNITY)
Admission: EM | Admit: 2012-01-14 | Discharge: 2012-01-18 | DRG: 917 | Disposition: A | Discharge status: Home / Self Care | Payer: Medicare Other | Attending: Pulmonary Disease | Admitting: Pulmonary Disease

## 2012-01-14 ENCOUNTER — Emergency Department (HOSPITAL_COMMUNITY): Payer: Medicare Other

## 2012-01-14 DIAGNOSIS — J9601 Acute respiratory failure with hypoxia: Secondary | ICD-10-CM

## 2012-01-14 DIAGNOSIS — J440 Chronic obstructive pulmonary disease with acute lower respiratory infection: Secondary | ICD-10-CM | POA: Diagnosis present

## 2012-01-14 DIAGNOSIS — J449 Chronic obstructive pulmonary disease, unspecified: Secondary | ICD-10-CM | POA: Diagnosis present

## 2012-01-14 DIAGNOSIS — R579 Shock, unspecified: Secondary | ICD-10-CM

## 2012-01-14 DIAGNOSIS — T426X1A Poisoning by other antiepileptic and sedative-hypnotic drugs, accidental (unintentional), initial encounter: Principal | ICD-10-CM | POA: Diagnosis present

## 2012-01-14 DIAGNOSIS — R4182 Altered mental status, unspecified: Secondary | ICD-10-CM

## 2012-01-14 DIAGNOSIS — R29898 Other symptoms and signs involving the musculoskeletal system: Secondary | ICD-10-CM | POA: Diagnosis present

## 2012-01-14 DIAGNOSIS — Z930 Tracheostomy status: Secondary | ICD-10-CM

## 2012-01-14 DIAGNOSIS — I959 Hypotension, unspecified: Secondary | ICD-10-CM

## 2012-01-14 DIAGNOSIS — T40601A Poisoning by unspecified narcotics, accidental (unintentional), initial encounter: Secondary | ICD-10-CM

## 2012-01-14 DIAGNOSIS — F319 Bipolar disorder, unspecified: Secondary | ICD-10-CM | POA: Diagnosis present

## 2012-01-14 DIAGNOSIS — E119 Type 2 diabetes mellitus without complications: Secondary | ICD-10-CM | POA: Diagnosis present

## 2012-01-14 DIAGNOSIS — J9620 Acute and chronic respiratory failure, unspecified whether with hypoxia or hypercapnia: Secondary | ICD-10-CM | POA: Diagnosis present

## 2012-01-14 DIAGNOSIS — G9349 Other encephalopathy: Secondary | ICD-10-CM | POA: Diagnosis present

## 2012-01-14 DIAGNOSIS — E876 Hypokalemia: Secondary | ICD-10-CM

## 2012-01-14 DIAGNOSIS — J441 Chronic obstructive pulmonary disease with (acute) exacerbation: Secondary | ICD-10-CM

## 2012-01-14 DIAGNOSIS — Z79899 Other long term (current) drug therapy: Secondary | ICD-10-CM

## 2012-01-14 DIAGNOSIS — E871 Hypo-osmolality and hyponatremia: Secondary | ICD-10-CM | POA: Diagnosis present

## 2012-01-14 DIAGNOSIS — Y92009 Unspecified place in unspecified non-institutional (private) residence as the place of occurrence of the external cause: Secondary | ICD-10-CM

## 2012-01-14 DIAGNOSIS — A4902 Methicillin resistant Staphylococcus aureus infection, unspecified site: Secondary | ICD-10-CM | POA: Diagnosis present

## 2012-01-14 DIAGNOSIS — D638 Anemia in other chronic diseases classified elsewhere: Secondary | ICD-10-CM

## 2012-01-14 DIAGNOSIS — R5381 Other malaise: Secondary | ICD-10-CM | POA: Diagnosis present

## 2012-01-14 DIAGNOSIS — J9600 Acute respiratory failure, unspecified whether with hypoxia or hypercapnia: Secondary | ICD-10-CM

## 2012-01-14 DIAGNOSIS — J209 Acute bronchitis, unspecified: Secondary | ICD-10-CM | POA: Diagnosis present

## 2012-01-14 DIAGNOSIS — D649 Anemia, unspecified: Secondary | ICD-10-CM

## 2012-01-14 DIAGNOSIS — G9340 Encephalopathy, unspecified: Secondary | ICD-10-CM | POA: Diagnosis present

## 2012-01-14 HISTORY — DX: Chronic obstructive pulmonary disease, unspecified: J44.9

## 2012-01-14 HISTORY — DX: Shortness of breath: R06.02

## 2012-01-14 HISTORY — DX: Mental disorder, not otherwise specified: F99

## 2012-01-14 HISTORY — DX: Type 2 diabetes mellitus without complications: E11.9

## 2012-01-14 LAB — URINALYSIS, MICROSCOPIC ONLY
Bilirubin Urine: NEGATIVE
Glucose, UA: 500 mg/dL — AB
Hgb urine dipstick: NEGATIVE
Ketones, ur: NEGATIVE mg/dL
Leukocytes, UA: NEGATIVE
Nitrite: NEGATIVE
Protein, ur: NEGATIVE mg/dL
Specific Gravity, Urine: 1.011 (ref 1.005–1.030)
Urobilinogen, UA: 0.2 mg/dL (ref 0.0–1.0)
pH: 6 (ref 5.0–8.0)

## 2012-01-14 LAB — CG4 I-STAT (LACTIC ACID): Lactic Acid, Venous: 2.44 mmol/L — ABNORMAL HIGH (ref 0.5–2.2)

## 2012-01-14 LAB — POCT I-STAT 3, ART BLOOD GAS (G3+)
Acid-base deficit: 1 mmol/L (ref 0.0–2.0)
Acid-base deficit: 4 mmol/L — ABNORMAL HIGH (ref 0.0–2.0)
Bicarbonate: 26.7 meq/L — ABNORMAL HIGH (ref 20.0–24.0)
Bicarbonate: 23.8 mEq/L (ref 20.0–24.0)
O2 Saturation: 91 %
O2 Saturation: 96 %
TCO2: 28 mmol/L (ref 0–100)
TCO2: 25 mmol/L (ref 0–100)
pCO2 arterial: 58.3 mmHg (ref 35.0–45.0)
pCO2 arterial: 57.5 mmHg (ref 35.0–45.0)
pH, Arterial: 7.27 — ABNORMAL LOW (ref 7.350–7.450)
pH, Arterial: 7.225 — ABNORMAL LOW (ref 7.350–7.450)
pO2, Arterial: 70 mmHg — ABNORMAL LOW (ref 80.0–100.0)
pO2, Arterial: 98 mmHg (ref 80.0–100.0)

## 2012-01-14 LAB — RAPID URINE DRUG SCREEN, HOSP PERFORMED
Amphetamines: NOT DETECTED
Barbiturates: NOT DETECTED
Benzodiazepines: NOT DETECTED
Cocaine: NOT DETECTED
Opiates: NOT DETECTED
Tetrahydrocannabinol: NOT DETECTED

## 2012-01-14 LAB — CBC WITH DIFFERENTIAL/PLATELET
Basophils Absolute: 0 10*3/uL (ref 0.0–0.1)
Basophils Relative: 0 % (ref 0–1)
Eosinophils Absolute: 0.1 10*3/uL (ref 0.0–0.7)
Eosinophils Relative: 1 % (ref 0–5)
HCT: 30.4 % — ABNORMAL LOW (ref 36.0–46.0)
Hemoglobin: 9.7 g/dL — ABNORMAL LOW (ref 12.0–15.0)
Lymphocytes Relative: 9 % — ABNORMAL LOW (ref 12–46)
Lymphs Abs: 0.8 K/uL (ref 0.7–4.0)
MCH: 29.8 pg (ref 26.0–34.0)
MCHC: 31.9 g/dL (ref 30.0–36.0)
MCV: 93.3 fL (ref 78.0–100.0)
Monocytes Absolute: 0.4 K/uL (ref 0.1–1.0)
Monocytes Relative: 4 % (ref 3–12)
Neutro Abs: 8.4 K/uL — ABNORMAL HIGH (ref 1.7–7.7)
Neutrophils Relative %: 87 % — ABNORMAL HIGH (ref 43–77)
Platelets: 158 10*3/uL (ref 150–400)
RBC: 3.26 MIL/uL — ABNORMAL LOW (ref 3.87–5.11)
RDW: 14.1 % (ref 11.5–15.5)
WBC: 9.7 K/uL (ref 4.0–10.5)

## 2012-01-14 LAB — COMPREHENSIVE METABOLIC PANEL
ALT: 13 U/L (ref 0–35)
AST: 10 U/L (ref 0–37)
Calcium: 9.8 mg/dL (ref 8.4–10.5)
Creatinine, Ser: 0.67 mg/dL (ref 0.50–1.10)
GFR calc Af Amer: >90 mL/min (ref >90)
Sodium: 135 mEq/L (ref 135–145)
Total Protein: 4.8 g/dL — ABNORMAL LOW (ref 6.0–8.3)

## 2012-01-14 LAB — POCT I-STAT 3, VENOUS BLOOD GAS (G3P V)
Acid-Base Excess: 2 mmol/L (ref 0.0–2.0)
Bicarbonate: 28.7 mEq/L — ABNORMAL HIGH (ref 20.0–24.0)
O2 Saturation: 98 %
TCO2: 30 mmol/L (ref 0–100)
pCO2, Ven: 51.6 mmHg — ABNORMAL HIGH (ref 45.0–50.0)
pH, Ven: 7.354 — ABNORMAL HIGH (ref 7.250–7.300)
pO2, Ven: 120 mmHg — ABNORMAL HIGH (ref 30.0–45.0)

## 2012-01-14 LAB — OCCULT BLOOD, POC DEVICE: Fecal Occult Bld: NEGATIVE

## 2012-01-14 LAB — COMPREHENSIVE METABOLIC PANEL WITH GFR
Albumin: 2 g/dL — ABNORMAL LOW (ref 3.5–5.2)
Alkaline Phosphatase: 109 U/L (ref 39–117)
BUN: 5 mg/dL — ABNORMAL LOW (ref 6–23)
CO2: 28 meq/L (ref 19–32)
Chloride: 98 meq/L (ref 96–112)
GFR calc non Af Amer: >90 mL/min (ref >90)
Glucose, Bld: 245 mg/dL — ABNORMAL HIGH (ref 70–99)
Potassium: 3.3 meq/L — ABNORMAL LOW (ref 3.5–5.1)
Total Bilirubin: 0.3 mg/dL (ref 0.3–1.2)

## 2012-01-14 LAB — PROCALCITONIN: Procalcitonin: <0.1 ng/mL

## 2012-01-14 LAB — TROPONIN I
Troponin I: <0.3 ng/mL (ref <0.30)
Troponin I: <0.3 ng/mL (ref <0.30)

## 2012-01-14 LAB — ETHANOL: Alcohol, Ethyl (B): <11 mg/dL (ref 0–11)

## 2012-01-14 LAB — GLUCOSE, CAPILLARY
Glucose-Capillary: 405 mg/dL — ABNORMAL HIGH (ref 70–99)
Glucose-Capillary: 443 mg/dL — ABNORMAL HIGH (ref 70–99)
Glucose-Capillary: 387 mg/dL — ABNORMAL HIGH (ref 70–99)

## 2012-01-14 LAB — LACTIC ACID, PLASMA: Lactic Acid, Venous: 2.5 mmol/L — ABNORMAL HIGH (ref 0.5–2.2)

## 2012-01-14 LAB — STREP PNEUMONIAE URINARY ANTIGEN: Strep Pneumo Urinary Antigen: NEGATIVE

## 2012-01-14 LAB — LIPASE, BLOOD: Lipase: 48 U/L (ref 11–59)

## 2012-01-14 MED ORDER — CHLORHEXIDINE GLUCONATE 0.12 % MT SOLN
OROMUCOSAL | Status: AC
  Administered 2012-01-14: 15 mL
  Filled 2012-01-14: qty 15

## 2012-01-14 MED ORDER — NALOXONE HCL 0.4 MG/ML IJ SOLN
INTRAMUSCULAR | Status: AC
  Administered 2012-01-14: 2 mg
  Filled 2012-01-14: qty 5

## 2012-01-14 MED ORDER — SODIUM CHLORIDE 0.9 % IV BOLUS (SEPSIS)
1000.0000 mL | Freq: Once | INTRAVENOUS | Status: AC
  Administered 2012-01-14: 1000 mL via INTRAVENOUS

## 2012-01-14 MED ORDER — PIPERACILLIN-TAZOBACTAM 3.375 G IVPB
3.3750 g | Freq: Three times a day (TID) | INTRAVENOUS | Status: DC
  Administered 2012-01-14 – 2012-01-16 (×6): 3.375 g via INTRAVENOUS
  Filled 2012-01-14 (×8): qty 50

## 2012-01-14 MED ORDER — HEPARIN SODIUM (PORCINE) 5000 UNIT/ML IJ SOLN
5000.0000 [IU] | Freq: Three times a day (TID) | INTRAMUSCULAR | Status: DC
  Filled 2012-01-14 (×2): qty 1

## 2012-01-14 MED ORDER — HEPARIN BOLUS VIA INFUSION
3000.0000 [IU] | Freq: Once | INTRAVENOUS | Status: AC
  Administered 2012-01-14: 3000 [IU] via INTRAVENOUS
  Filled 2012-01-14: qty 3000

## 2012-01-14 MED ORDER — VASOPRESSIN 20 UNIT/ML IJ SOLN
0.0300 [IU]/min | INTRAVENOUS | Status: DC
  Administered 2012-01-14 – 2012-01-15 (×2): 0.03 [IU]/min via INTRAVENOUS
  Filled 2012-01-14 (×3): qty 2.5

## 2012-01-14 MED ORDER — POTASSIUM CHLORIDE 10 MEQ/50ML IV SOLN
INTRAVENOUS | Status: AC
  Administered 2012-01-14: 10 meq
  Filled 2012-01-14: qty 50

## 2012-01-14 MED ORDER — NALOXONE HCL 1 MG/ML IJ SOLN
1.0000 mg | Freq: Once | INTRAMUSCULAR | Status: AC
  Administered 2012-01-14: 1 mg via INTRAVENOUS
  Filled 2012-01-14: qty 2

## 2012-01-14 MED ORDER — POTASSIUM CHLORIDE 10 MEQ/50ML IV SOLN
10.0000 meq | INTRAVENOUS | Status: AC
  Administered 2012-01-14 (×2): 10 meq via INTRAVENOUS

## 2012-01-14 MED ORDER — PANTOPRAZOLE SODIUM 40 MG IV SOLR
40.0000 mg | Freq: Every day | INTRAVENOUS | Status: DC
  Administered 2012-01-14 – 2012-01-16 (×3): 40 mg via INTRAVENOUS
  Filled 2012-01-14 (×4): qty 40

## 2012-01-14 MED ORDER — PHENYLEPHRINE HCL 10 MG/ML IJ SOLN
30.0000 ug/min | INTRAVENOUS | Status: DC
  Administered 2012-01-14: 30 ug/min via INTRAVENOUS
  Filled 2012-01-14 (×3): qty 1

## 2012-01-14 MED ORDER — ALBUTEROL SULFATE HFA 108 (90 BASE) MCG/ACT IN AERS
4.0000 | INHALATION_SPRAY | RESPIRATORY_TRACT | Status: DC
  Administered 2012-01-15 – 2012-01-17 (×18): 4 via RESPIRATORY_TRACT
  Filled 2012-01-14: qty 6.7

## 2012-01-14 MED ORDER — HYDROCORTISONE SOD SUCCINATE 100 MG IJ SOLR
100.0000 mg | Freq: Once | INTRAMUSCULAR | Status: AC
  Administered 2012-01-14: 100 mg via INTRAVENOUS
  Filled 2012-01-14: qty 2

## 2012-01-14 MED ORDER — POTASSIUM CHLORIDE 10 MEQ/50ML IV SOLN
INTRAVENOUS | Status: AC
  Administered 2012-01-14: 10 meq
  Filled 2012-01-14: qty 200

## 2012-01-14 MED ORDER — FENTANYL CITRATE 0.05 MG/ML IJ SOLN
50.0000 ug | INTRAMUSCULAR | Status: DC | PRN
  Administered 2012-01-15: 100 ug via INTRAVENOUS
  Administered 2012-01-15: 50 ug via INTRAVENOUS
  Administered 2012-01-15: 100 ug via INTRAVENOUS
  Administered 2012-01-16: 50 ug via INTRAVENOUS
  Administered 2012-01-16: 100 ug via INTRAVENOUS
  Filled 2012-01-14 (×6): qty 2

## 2012-01-14 MED ORDER — HYDROCORTISONE SOD SUCCINATE 100 MG IJ SOLR
50.0000 mg | Freq: Four times a day (QID) | INTRAMUSCULAR | Status: DC
  Administered 2012-01-14 – 2012-01-16 (×7): 50 mg via INTRAVENOUS
  Filled 2012-01-14 (×10): qty 1

## 2012-01-14 MED ORDER — NALOXONE HCL 1 MG/ML IJ SOLN
1.0000 mg | Freq: Once | INTRAMUSCULAR | Status: AC
  Administered 2012-01-14: 1 mg via INTRAVENOUS

## 2012-01-14 MED ORDER — SODIUM CHLORIDE 0.9 % IV SOLN
INTRAVENOUS | Status: DC
  Administered 2012-01-14: 3.8 [IU]/h via INTRAVENOUS
  Administered 2012-01-15: 11.1 [IU]/h via INTRAVENOUS
  Administered 2012-01-15: 12.3 [IU]/h via INTRAVENOUS
  Filled 2012-01-14 (×2): qty 1

## 2012-01-14 MED ORDER — NOREPINEPHRINE BITARTRATE 1 MG/ML IJ SOLN
2.0000 ug/min | INTRAVENOUS | Status: DC
  Administered 2012-01-14 – 2012-01-15 (×4): 50 ug/min via INTRAVENOUS
  Filled 2012-01-14 (×7): qty 16

## 2012-01-14 MED ORDER — NALOXONE HCL 1 MG/ML IJ SOLN
1.5000 mg/h | INTRAVENOUS | Status: DC
  Administered 2012-01-14: 1.5 mg/h via INTRAVENOUS
  Administered 2012-01-14: 5 mg/h via INTRAVENOUS
  Filled 2012-01-14 (×4): qty 4

## 2012-01-14 MED ORDER — HEPARIN BOLUS VIA INFUSION
2000.0000 [IU] | Freq: Once | INTRAVENOUS | Status: DC
  Filled 2012-01-14: qty 2000

## 2012-01-14 MED ORDER — DEXTROSE 5 % IV SOLN
30.0000 ug/min | INTRAVENOUS | Status: DC
  Administered 2012-01-14 (×2): 200 ug/min via INTRAVENOUS
  Administered 2012-01-15: 150 ug/min via INTRAVENOUS
  Administered 2012-01-15: 100 ug/min via INTRAVENOUS
  Administered 2012-01-15 (×2): 200 ug/min via INTRAVENOUS
  Filled 2012-01-14 (×9): qty 4

## 2012-01-14 MED ORDER — IPRATROPIUM BROMIDE HFA 17 MCG/ACT IN AERS
4.0000 | INHALATION_SPRAY | Freq: Four times a day (QID) | RESPIRATORY_TRACT | Status: DC
  Administered 2012-01-15 – 2012-01-17 (×10): 4 via RESPIRATORY_TRACT
  Filled 2012-01-14: qty 12.9

## 2012-01-14 MED ORDER — SODIUM CHLORIDE 0.9 % IV SOLN: 750.0000 mL | INTRAVENOUS | Status: DC | PRN

## 2012-01-14 MED ORDER — NALOXONE HCL 1 MG/ML IJ SOLN: 2.0000 mg | Freq: Once | INTRAMUSCULAR | Status: DC

## 2012-01-14 MED ORDER — SODIUM CHLORIDE 0.9 % IV SOLN
750.0000 mL | INTRAVENOUS | Status: DC | PRN
  Administered 2012-01-15 (×2): 750 mL via INTRAVENOUS

## 2012-01-14 MED ORDER — DEXTROSE 5 % IV SOLN
2.0000 ug/min | INTRAVENOUS | Status: DC
  Administered 2012-01-14: 10 ug/min via INTRAVENOUS
  Filled 2012-01-14 (×2): qty 4

## 2012-01-14 MED ORDER — INSULIN ASPART 100 UNIT/ML ~~LOC~~ SOLN: 2.0000 [IU] | SUBCUTANEOUS | Status: DC

## 2012-01-14 MED ORDER — POTASSIUM CHLORIDE 10 MEQ/100ML IV SOLN: 10.0000 meq | INTRAVENOUS | Status: AC

## 2012-01-14 MED ORDER — MIDAZOLAM HCL 2 MG/2ML IJ SOLN
2.0000 mg | INTRAMUSCULAR | Status: DC | PRN
  Administered 2012-01-15: 4 mg via INTRAVENOUS
  Administered 2012-01-15 – 2012-01-16 (×2): 2 mg via INTRAVENOUS
  Filled 2012-01-14: qty 2
  Filled 2012-01-14: qty 4
  Filled 2012-01-14 (×2): qty 2

## 2012-01-14 MED ORDER — SODIUM CHLORIDE 0.9 % IV SOLN
INTRAVENOUS | Status: DC
  Administered 2012-01-14: 19:00:00 via INTRAVENOUS

## 2012-01-14 MED ORDER — SODIUM CHLORIDE 0.9 % IV SOLN
250.0000 mL | INTRAVENOUS | Status: DC | PRN
  Administered 2012-01-14: 250 mL via INTRAVENOUS

## 2012-01-14 MED ORDER — HEPARIN (PORCINE) IN NACL 100-0.45 UNIT/ML-% IJ SOLN
1100.0000 [IU]/h | INTRAMUSCULAR | Status: DC
  Administered 2012-01-14 – 2012-01-15 (×2): 1100 [IU]/h via INTRAVENOUS
  Filled 2012-01-14 (×3): qty 250

## 2012-01-14 MED ORDER — PIPERACILLIN-TAZOBACTAM 3.375 G IVPB 30 MIN
3.3750 g | Freq: Once | INTRAVENOUS | Status: DC
  Filled 2012-01-14: qty 50

## 2012-01-14 NOTE — Procedures (Signed)
Name: Natasha [name] MRN: [id number] DOB: August 02, 1960  PROCEDURE NOTE  Procedure:  Arterial catheter placement.  Indications:  Need for invasive hemodynamic monitoring / frequent arterial blood gases measurement.  Consent:  Consent was implied due to the emergency nature of the procedure.  Procedure summary:  The patient was identified as Natasha [name] and safety timeout was performed. Sterile technique was used. The patient's right groin was prepped using chlorhexidine / alcohol scrub and the field was draped in usual sterile fashion with protective barrier. The right femorall artery was cannulated without difficulty. Blood was aspirated and the catheter was flushed with normal saline without difficulty. Good arterial waveform was obtained. The catheter was secured into place with sterile dressing.  Complications:  No immediate complications were noted.  Estimated blood loss:  Less then 5 mL  Orlean Bradford, M.D. Pulmonary and Critical Care Medicine [facility] Cell: [phone] Pager: [phone]  01/14/2012, 8:50 PM

## 2012-01-14 NOTE — ED Notes (Signed)
Pt is after husband could not get patient to respond this am.  Per ems patient's pupils were pinpoint on arrival and Narcan 2mg  was administered. Original BP was 60/28 and ems gave one amp of Calcium.  CBg 286.  Pt has trach collar and sats were 80s for ems.  RT called to bedside.  Pt arouses to voice.  Pt told ems that she took Hydrocodone for Chest pain

## 2012-01-14 NOTE — Progress Notes (Addendum)
ANTICOAGULATION CONSULT NOTE - Initial Consult  Pharmacy Consult for heparin Indication:  Possible PE  No Known Allergies  Patient Measurements: Height: 5\' 6"  (167.6 cm) Weight: 148 lb 13 oz (67.5 kg) IBW/kg (Calculated) : 59.3    Vital Signs: Temp: 97.6 F (36.4 C) (01/06 1855) Temp src: Oral (01/06 1855) BP: 79/41 mmHg (01/06 2100) Pulse Rate: 73  (01/06 2100)  Labs:  Basename 01/14/12 1639 01/14/12 1208 01/14/12 1207  HGB -- -- 9.7*  HCT -- -- 30.4*  PLT -- -- 158  APTT -- -- --  LABPROT -- -- --  INR -- -- --  HEPARINUNFRC -- -- --  CREATININE -- -- 0.67  CKTOTAL -- -- --  CKMB -- -- --  TROPONINI <0.30 <0.30 --    Estimated Creatinine Clearance: 79.6 ml/min (by C-G formula based on Cr of 0.67).   Medical History: History reviewed. No pertinent past medical history.  Medications:  Prescriptions prior to admission  Medication Sig Dispense Refill  . clonazePAM (KLONOPIN) 1 MG tablet Take 2 mg by mouth at bedtime as needed. For anxiety/sleep      . diltiazem (CARDIZEM) 30 MG tablet Take 30 mg by mouth 3 (three) times daily.      . famotidine (PEPCID) 20 MG tablet Take 20 mg by mouth 2 (two) times daily.      Marland Kitchen glimepiride (AMARYL) 1 MG tablet Take 1 mg by mouth 2 (two) times daily.      Marland Kitchen lisinopril (PRINIVIL,ZESTRIL) 10 MG tablet Take 10 mg by mouth daily.      Marland Kitchen oxyCODONE (OXY IR/ROXICODONE) 5 MG immediate release tablet Take 5 mg by mouth every 6 (six) hours as needed. For pain      . traZODone (DESYREL) 100 MG tablet Take 200-300 mg by mouth at bedtime as needed. For sleep      . ziprasidone (GEODON) 80 MG capsule Take 240 mg by mouth at bedtime.       Scheduled:    . albuterol  4 puff Inhalation Q3H  . [COMPLETED] chlorhexidine      . [COMPLETED] hydrocortisone sodium succinate  100 mg Intravenous Once  . hydrocortisone sod succinate (SOLU-CORTEF) injection  50 mg Intravenous Q6H  . ipratropium  4 puff Inhalation Q6H  . [COMPLETED] naloxone      .  [COMPLETED] naLOXone (NARCAN)  injection  1 mg Intravenous Once  . [COMPLETED] naLOXone (NARCAN)  injection  1 mg Intravenous Once  . pantoprazole (PROTONIX) IV  40 mg Intravenous QHS  . piperacillin-tazobactam  3.375 g Intravenous Once  . piperacillin-tazobactam (ZOSYN)  IV  3.375 g Intravenous Q8H  . [EXPIRED] potassium chloride  10 mEq Intravenous Q1 Hr x 4  . potassium chloride  10 mEq Intravenous Q1 Hr x 2  . [COMPLETED] potassium chloride      . [COMPLETED] potassium chloride      . [COMPLETED] sodium chloride  1,000 mL Intravenous Once  . [COMPLETED] sodium chloride  1,000 mL Intravenous Once  . [COMPLETED] sodium chloride  1,000 mL Intravenous Once  . [COMPLETED] sodium chloride  1,000 mL Intravenous Once  . sodium chloride  1,000 mL Intravenous Once  . [DISCONTINUED] heparin  5,000 Units Subcutaneous Q8H  . [DISCONTINUED] insulin aspart  2-6 Units Subcutaneous Q4H  . [DISCONTINUED] naLOXone (NARCAN)  injection  2 mg Intravenous Once    Assessment: 52 yo female with possible PE to start heparin. Heparin sq was ordered earlier but none has been given. Patient also noted s/p  arterial catheter placement earlier this evening.  Goal of Therapy:  Heparin level 0.3-0.7 units/ml Monitor platelets by anticoagulation protocol: Yes   Plan:  -Will give a reduced bolus of 3000 units (due to timing of line placement) and begin heparin at  1100 units/hr (~16 units/kg/hr) -Heparin level in 6 hours and daily wth CBC daily  Harland German, Pharm D 01/14/2012 9:30 PM

## 2012-01-14 NOTE — ED Notes (Signed)
Verbal order received, B. Ollie, NP to access central line prior to CXR confirmation

## 2012-01-14 NOTE — ED Notes (Signed)
Results of lactic acid shown to Dr. Ghim 

## 2012-01-14 NOTE — Procedures (Signed)
Central Venous Catheter Insertion Procedure Note Natasha [name] [id number] [birth year]  Procedure: Insertion of Central Venous Catheter Indications: Assessment of intravascular volume, Drug and/or fluid administration and Frequent blood sampling  Procedure Details Consent: Risks of procedure as well as the alternatives and risks of each were explained to the (patient/caregiver).  Consent for procedure obtained. Time Out: Verified patient identification, verified procedure, site/side was marked, verified correct patient position, special equipment/implants available, medications/allergies/relevent history reviewed, required imaging and test results available.  Performed  Maximum sterile technique was used including antiseptics, cap, gloves, gown, hand hygiene, mask and sheet. Skin prep: Chlorhexidine; local anesthetic administered A antimicrobial bonded/coated triple lumen catheter was placed in the right internal jugular vein using the Seldinger technique to 17 cm.  Evaluation Blood flow good Complications: No apparent complications Patient did tolerate procedure well. Chest X-[place] ordered to verify placement.  CXR: pending.   Natasha Brim, NP-C [place] [facility] & Critical [facility] Pgr: [phone] or [phone]  Ultrasound used for site verification, live visualisation of needle entry & guidewire prior to dilation  Natasha [name] V.

## 2012-01-14 NOTE — ED Notes (Signed)
Dr. Delford Field notified & aware of repeat ABG. No new orders received.

## 2012-01-14 NOTE — ED Notes (Signed)
Dr. Oletta Lamas at bedside. Aware of continued low BP.

## 2012-01-14 NOTE — ED Notes (Addendum)
Narcan gtt increased to 5mg /hr per order

## 2012-01-14 NOTE — ED Notes (Signed)
B. Ollie, NP at bedside to place central line.

## 2012-01-14 NOTE — ED Notes (Addendum)
B. Veleta Miners, NP with PCCM at bedside. Aware of continued hypotension

## 2012-01-14 NOTE — Progress Notes (Signed)
eLink Physician-Brief Progress Note Patient Name: Natasha [name] DOB: 12-01-1960 MRN: [id number]  Date of Service  01/14/2012   HPI/Events of Note   Shock, echo shows LV collapse and RV dilation  ??PE  eICU Interventions  Give more volume and heparinize   Intervention Category Major Interventions: Shock - evaluation and management  Shan Levans 01/14/2012, 8:49 PM

## 2012-01-14 NOTE — ED Notes (Signed)
RT notified of need for ABG.

## 2012-01-14 NOTE — Progress Notes (Signed)
ANTIBIOTIC CONSULT NOTE - INITIAL  Pharmacy Consult for Zosyn Indication: HCAP/Shock  No Known Allergies  Patient Measurements:   Adjusted Body Weight:   Vital Signs: Temp: 98.8 F (37.1 C) (01/06 1608) Temp src: Rectal (01/06 1608) BP: 59/27 mmHg (01/06 1630) Pulse Rate: 66  (01/06 1630) Intake/Output from previous day:   Intake/Output from this shift: Total I/O In: 4250 [I.V.:4250] Out: -   Labs:  [facility] 01/14/12 1207  WBC 9.7  HGB 9.7*  PLT 158  LABCREA --  CREATININE 0.67   CrCl is unknown because there is no height on file for the current visit. No results found for this basename: VANCOTROUGH:2,VANCOPEAK:2,VANCORANDOM:2,GENTTROUGH:2,GENTPEAK:2,GENTRANDOM:2,TOBRATROUGH:2,TOBRAPEAK:2,TOBRARND:2,AMIKACINPEAK:2,AMIKACINTROU:2,AMIKACIN:2, in the last 72 hours   Microbiology: No results found for this or any previous visit (from the past 720 hour(s)).  Medical History: No past medical history on file.  Medications:  Scheduled:    . albuterol  4 puff Inhalation Q3H  . heparin  5,000 Units Subcutaneous Q8H  . [COMPLETED] hydrocortisone sodium succinate  100 mg Intravenous Once  . hydrocortisone sod succinate (SOLU-CORTEF) injection  50 mg Intravenous Q6H  . insulin aspart  2-6 Units Subcutaneous Q4H  . ipratropium  4 puff Inhalation Q6H  . [COMPLETED] naloxone      . [COMPLETED] naLOXone (NARCAN)  injection  1 mg Intravenous Once  . [COMPLETED] naLOXone (NARCAN)  injection  1 mg Intravenous Once  . pantoprazole (PROTONIX) IV  40 mg Intravenous QHS  . [DISCONTINUED] naLOXone (NARCAN)  injection  2 mg Intravenous Once   Assessment: Natasha [name] is admitted with AMS and hypotension. She is currently on dual pressors. Shock steroids ordered. She is afebrile and has a normal WBC count. Scr is nml at baseline. She is trach dependant chronically and is now on the vent with FiO2 0.60. Noted recent discharge from [facility] s/p prolonged stay d/t acute on chronic resp failure d/t  AECOPD. No abx administered thus far in ED.  Goal of Therapy:  Streamline abx when cx data available  Plan:  - Zosyn 3.375gm IV now over 30 minutes, then Zosyn 3.375gm IV Q8h, each dose infused over 4 hours starting at 2300 tonight. - Will monitor cx/spec/sens, renal fn and clinical status daily.  Thanks, Andee Chivers K. Allena Katz, PharmD, BCPS.  Clinical Pharmacist Pager [phone]. 01/14/2012 5:29 PM

## 2012-01-14 NOTE — ED Provider Notes (Signed)
History     CSN: [id number]  Arrival date & time 01/14/12  1139   First MD Initiated Contact with Patient 01/14/12 1141      Chief Complaint  Patient presents with  . Altered Mental Status    (Consider location/radiation/quality/duration/timing/severity/associated sxs/prior treatment) HPI Comments: Level 5 caveat due to unresponsiveness. Patient is brought here from [facility] EMS do to unresponsiveness. Patient with history of tracheostomy but now at home was seen at her normal state by patient's spouse early this morning. He gone to the store and when he came back he found her unresponsive. She is breathing on her own but would not respond to his voice or to shaking. EMS reports that they did give her small doses of Narcan which did cause some transient improvement in her mentation. She apparently did have an episode of chest pain by report and may have taken her pain medication this morning. She had most of her medications laying in front of her on the table. Otherwise there is no report given a change to her general state of health over the last few days. She was recently admitted here at [facility] and recently discharged and therefore [place] brought her here to [facility] cone.  Patient is a 52 y.o. female presenting with altered mental status. The history is provided by the patient, the EMS personnel and medical records. The history is limited by the condition of the patient.  Altered Mental Status    No past medical history on file.  No past surgical history on file.  No family history on file.  History  Substance Use Topics  . Smoking status: Not on file  . Smokeless tobacco: Not on file  . Alcohol Use: Not on file    OB History    No data available      Review of Systems  Unable to perform ROS: Mental status change  Psychiatric/Behavioral: Positive for altered mental status.    Allergies  Review of patient's allergies indicates not on file.  Home Medications   No current outpatient prescriptions on file.  BP 77/32  Pulse 73  Resp 21  SpO2 91%  Physical Exam  Nursing note and vitals reviewed. Constitutional: She appears well-developed and well-nourished. She is uncooperative. She appears ill. No distress.       Oxygen is applied to her trach collar by EMS  HENT:  Head: Normocephalic and atraumatic.  Mouth/Throat: Uvula is midline. Mucous membranes are not pale and dry.  Eyes:       Pupils are equal, round, sluggishly reactive  Neck: Normal range of motion. Neck supple.  Cardiovascular: Normal rate and regular rhythm.   No murmur heard. Pulmonary/Chest: Effort normal. No respiratory distress. She has no wheezes. She has no rales.  Abdominal: Soft. She exhibits no distension. There is no tenderness.  Genitourinary: Rectal exam shows anal tone normal. Guaiac negative stool.  Neurological: GCS eye subscore is 1. GCS verbal subscore is 3. GCS motor subscore is 5.       Patient would rales and grunts with noxious stimuli.  Skin: Skin is warm.    ED Course  Procedures (including critical care time)  CRITICAL CARE Performed by: Lear Ng.   Total critical care time: 30 min  Critical care time was exclusive of separately billable procedures and treating other patients.  Critical care was necessary to treat or prevent imminent or life-threatening deterioration.  Critical care was time spent personally by me on the following activities: development of  treatment plan with patient and/or surrogate as well as nursing, discussions with consultants, evaluation of patient's response to treatment, examination of patient, obtaining history from patient or surrogate, ordering and performing treatments and interventions, ordering and review of laboratory studies, ordering and review of radiographic studies, pulse oximetry and re-evaluation of patient's condition.   Labs Reviewed  CBC WITH DIFFERENTIAL - Abnormal; Notable for the following:     RBC 3.26 (*)     Hemoglobin 9.7 (*)     HCT 30.4 (*)     Neutrophils Relative 87 (*)     Neutro Abs 8.4 (*)     Lymphocytes Relative 9 (*)     All other components within normal limits  COMPREHENSIVE METABOLIC PANEL - Abnormal; Notable for the following:    Potassium 3.3 (*)     Glucose, Bld 245 (*)     BUN 5 (*)     Total Protein 4.8 (*)     Albumin 2.0 (*)     All other components within normal limits  CG4 I-STAT (LACTIC ACID) - Abnormal; Notable for the following:    Lactic Acid, Venous 2.44 (*)     All other components within normal limits  POCT I-STAT 3, BLOOD GAS (G3P V) - Abnormal; Notable for the following:    pH, Ven 7.354 (*)     pCO2, Ven 51.6 (*)     pO2, Ven 120.0 (*)     Bicarbonate 28.7 (*)     All other components within normal limits  LIPASE, BLOOD  ETHANOL  TROPONIN I  OCCULT BLOOD, POC DEVICE  CULTURE, BLOOD (ROUTINE X 2)  CULTURE, BLOOD (ROUTINE X 2)  URINE RAPID DRUG SCREEN (HOSP PERFORMED)  URINALYSIS, MICROSCOPIC ONLY   Dg Chest Port 1 View  01/14/2012  *RADIOLOGY REPORT*  Clinical Data: Chest pain.  Weakness and shortness of breath.  PORTABLE CHEST - 1 VIEW  Comparison: Chest x-[place] 01/03/2012.  Findings: A tracheostomy tube is in place with tip 7.8 cm above the carina. Lung volumes are normal.  No consolidative airspace disease.  No pleural effusions.  No pneumothorax.  No pulmonary nodule or mass noted.  Pulmonary vasculature and the cardiomediastinal silhouette are within normal limits.  Prominent interstitial markings and peribronchial cuffing again noted throughout the lung bases bilaterally, similar to the prior examination.  Atherosclerosis in the thoracic aorta.  A suture line in the right apex, likely from prior wedge resection.  IMPRESSION: 1. Interstitial prominence and peribronchial cuffing throughout the lung bases bilaterally, similar to prior examination 12/14/2011. Findings may reflect persistent bronchopneumonia (particularly in the left lower  lobe) or sequelae of aspiration.  Clinical correlation is recommended. 2.  Atherosclerosis. 3.  Postoperative changes and support apparatus, as above.   Original Report Authenticated By: Trudie Reed, M.D.    I reviewed portable chest x-Gut as stated above.  1. Narcotic overdose   2. Altered mental status   3. Hypotension   4. Anemia     EKG performed at time 11:40 shows sinus rhythm at a rate of 69 with possible ectopic atrial focus. Borderline PR prolongation at 200 ms. Axis is normal. Nonspecific flattened T waves in lateral leads. Similar in appearance to EKG from 12/27/2011.  Saturation with supplemental oxygen is 91% which is low.    2:41 PM Patient remains relatively hypotensive despite IV fluids. I did order hydrocortisone 100 mg for possible distributive shot given the patient likely has been on steroids do to her history of COPD in the  past. She was afebrile. Her hemoglobin is lower than at baseline and she does appear pale, however her Hemoccult is negative and she is not tachycardic. No obvious signs of infection are seen at this time. She is continuing to require Narcan drip which keeps her in a mental state where she can be easily aroused to loud voice. I've consulted with pulmonary critical care who accepts patient to the ICU and they will see here as soon as they're able to.  MDM   Patient likely with narcotic overdose. 2 mg of Narcan is given here and patient is now more awake and alert and conversant. She reports that she did take 3 of her pain pills rather than her usual to do to her chest pain which is now apparently resolved. She remained somewhat somnolent however. She also takes benzodiazepines for presumed anxiety. Initial blood pressure was low but likely do to her excessively somnolent state. Patient is receiving continued IV fluids here for support and after Narcan given, the first of systolic improved into the mid 80s. Plan is to obtain routine blood tests, head CT,  chest x-[place] and will start a Narcan drip and will require admission likely to step down.        Gavin Pound. Angelito Hopping, MD 01/14/12 1442

## 2012-01-14 NOTE — H&P (Signed)
PULMONARY  / CRITICAL CARE MEDICINE  Name: Natasha [name] MRN: [id number] DOB: June 23, 1960    LOS: 0  REFERRING MD :  Oletta Lamas   CHIEF COMPLAINT:  Altered MS and hypotension   BRIEF PATIENT DESCRIPTION:  52 year old female recently d/c from [facility] on 1/2 s/p prolonged stay for vent weaning in setting of Acute on Chronic respiratory failure due to AECOPD. Presents to [facility] on 1/6 w/ acute onset of altered MS and hypotension.   LINES / TUBES: Trach #4 Cuffless (chronic)>>>  CULTURES: Sputum 1/6>>> UC 1/6>>> BCX2 1/6>>>  ANTIBIOTICS:   SIGNIFICANT EVENTS:  UDS 1/6>>> Echo 1/6>>> CT chest 1/6>>> LE dopplers 1/6>>>  LEVEL OF CARE:  ICU  PRIMARY SERVICE:  PCCM  CONSULTANTS:   CODE STATUS: full  DIET:   DVT Px:  Skillman heparin  GI Px:  PPI   HISTORY OF PRESENT ILLNESS:   52 year old female, recently discharged from [facility] s/p prolonged hospital stay for acute on chronic respiratory failure in setting of AECOPD which required mechanical ventilation. She was chronically trach dependant prior to her most recent hospital stay. She was discharged to home from [facility] 01/10/12. Noted to be in usual State of health w/ no acute complaints as of 1/5. Per husband he had seen her in am hours, left to go to store and on return to home found her with decreased LOC. In ER she was found to be lethargic and hypotensive. She was given narcan bolus w/ intermittent response resulting in agitation. PCCM was asked to admit.   PAST MEDICAL HISTORY :  Acute respiratory failure with hypoxia  COPD exacerbation  Tracheostomy dependant  Acute-on-chronic respiratory failure  COPD, severe  Diabetes, type 2  Bipolar disorder  Muscular deconditioning   Prior to Admission medications   Awaiting med list    Allergies not on file  FAMILY HISTORY:  No family history on file. SOCIAL HISTORY:  does not have a smoking history on file. She does not have any smokeless tobacco history on file. Her alcohol and drug histories not  on file.  REVIEW OF SYSTEMS:   Unable   INTERVAL HISTORY:  More awake, after narcan   VITAL SIGNS: Pulse Rate:  [67-90] 77  (01/06 1530) Resp:  [17-25] 17  (01/06 1530) BP: (58-84)/(26-49) 69/35 mmHg (01/06 1530) SpO2:  [88 %-99 %] 92 % (01/06 1530) FiO2 (%):  [60 %] 60 % (01/06 1530) HEMODYNAMICS:   VENTILATOR SETTINGS: Vent Mode:  [-]  FiO2 (%):  [60 %] 60 % INTAKE / OUTPUT: Intake/Output      01/05 0701 - 01/06 0700 01/06 0701 - 01/07 0700   I.V.  4250   Total Intake  4250   Net  +4250          PHYSICAL EXAMINATION: General:  Chronically ill appearing white female. Agitated after narcan injection  Neuro:  Agitated fluctuating w/ sedated status  HEENT:  #4 cuffless trach, thick secretions  Cardiovascular:  rrr Lungs:  Scattered course rhonchi  Abdomen:  Soft, non-tender  Musculoskeletal:  Intact  Skin:  Intact    LABS: Cbc  Lab 01/14/12 1207  WBC 9.7  HGB 9.7*  HCT 30.4*  PLT 158   Chemistry  Lab 01/14/12 1207  NA 135  K 3.3*  CL 98  CO2 28  BUN 5*  CREATININE 0.67  CALCIUM 9.8  MG --  PHOS --  GLUCOSE 245*   Liver fxn  Lab 01/14/12 1207  AST 10  ALT 13  ALKPHOS 109  BILITOT 0.3  PROT 4.8*  ALBUMIN 2.0*   coags No results found for this basename: APTT:3,INR:3 in the last 168 hours Sepsis markers  Lab 01/14/12 1205  LATICACIDVEN 2.44*  PROCALCITON --   Cardiac markers  Lab 01/14/12 1208  CKTOTAL --  CKMB --  TROPONINI <0.30   BNP No results found for this basename: PROBNP:3 in the last 168 hours ABG  Lab 01/14/12 1514 01/14/12 1206  PHART 7.270* --  PCO2ART 58.3* --  PO2ART 70.0* --  HCO3 26.7* 28.7*  TCO2 28 30    CBG trend No results found for this basename: GLUCAP:5 in the last 168 hours  IMAGING: Trach in good position. No acute findings.   ECG:  DIAGNOSES: Principal Problem:  *Acute encephalopathy Active Problems:  Acute-on-chronic respiratory failure  Shock circulatory  Tracheostomy dependence   COPD, severe  Diabetes, type 2  Anemia of chronic disease  Hypokalemia  Bipolar disorder  Muscular deconditioning   ASSESSMENT / PLAN:  PULMONARY  ASSESSMENT: Acute on Chronic hypercarbic respiratory failure  Severe COPD: do not think this is an acute purulent bronchitis.  Tracheostomy dependant  Think this primarily narcotic induced hypoventilation superimposed on underlying severe COPD w/ very little pulmonary or physical reserves. She seems to have improved some w/ narcan.   PLAN:   Admit to ICU Place on vent & ventilate -PRVC/ 450/RR 20- rpt ABg in 1h Cont BDs Sputum culture   CARDIOVASCULAR  ASSESSMENT:  Circulatory shock.  Etiology is unclear currently. Has received 4 liters of crystalloid in ER, favor narcotic effect and possibly adrenal insuff. Sepsis on diff dx but does not meet SIRS criteria. Could consider air-trapping, but doubt. Need to know her home med list PLAN:  Stress dose steroids Neo-gtt-->transition to levophed Hold all narcotics and sedating gtts Check PCT Cycle CEs CT chest r/o PE Doppler LEs Hold all antihypertensives  RENAL  ASSESSMENT:   Hypokalemia  PLAN:   Replace and recheck as indicated  GASTROINTESTINAL  ASSESSMENT:   No acute issue  PLAN: NPO for now   HEMATOLOGIC  ASSESSMENT:   Chronic anemia: no evidence of bleeding  PLAN:  F/u chemistry   INFECTIOUS  ASSESSMENT:   No active evidence of infection  PLAN:   Sputum culture  BC   ENDOCRINE  ASSESSMENT:   Presumed adrenal insuff.  Hyperglycemia  PLAN:   Stress dose steroids.  ssi  NEUROLOGIC  ASSESSMENT:   Acute encephalopathy Favor residual narcotic effect and hypercarbia. Doubt acute neurologic injury.  PLAN:   Supportive care CT head ordered  GLOBAL- updated son & boyfriend at bedside   I have personally obtained a history, examined the patient, evaluated laboratory and imaging results, formulated the assessment and plan and placed  orders. CRITICAL CARE: The patient is critically ill with multiple organ systems failure and requires high complexity decision making for assessment and support, frequent evaluation and titration of therapies, application of advanced monitoring technologies and extensive interpretation of multiple databases. Critical Care Time devoted to patient care services described in this note is 60 minutes.   Oretha Milch  Pulmonary and Critical Care Medicine [facility] Pager: [phone]  01/14/2012, 3:45 PM

## 2012-01-14 NOTE — ED Notes (Signed)
PCCM MD at bedside. Trach changed to cuffed

## 2012-01-14 NOTE — Progress Notes (Signed)
eLink Physician-Brief Progress Note Patient Name: Natasha [name] DOB: 1960/08/01 MRN: [id number]  Date of Service  01/14/2012   HPI/Events of Note   Pt in shock state  eICU Interventions  Start vasopressin and give saline bolus   Intervention Category Major Interventions: Shock - evaluation and management  Shan Levans 01/14/2012, 6:51 PM

## 2012-01-14 NOTE — ED Notes (Signed)
Pt placed on ventilatorm per RT. PCCM MD, Dr. Vassie Loll at bedside

## 2012-01-15 ENCOUNTER — Inpatient Hospital Stay (HOSPITAL_COMMUNITY): Payer: Medicare Other

## 2012-01-15 ENCOUNTER — Encounter (HOSPITAL_COMMUNITY): Payer: Self-pay | Admitting: *Deleted

## 2012-01-15 DIAGNOSIS — J4489 Other specified chronic obstructive pulmonary disease: Secondary | ICD-10-CM

## 2012-01-15 DIAGNOSIS — M7989 Other specified soft tissue disorders: Secondary | ICD-10-CM

## 2012-01-15 DIAGNOSIS — T426X4A Poisoning by other antiepileptic and sedative-hypnotic drugs, undetermined, initial encounter: Secondary | ICD-10-CM

## 2012-01-15 DIAGNOSIS — T426X1A Poisoning by other antiepileptic and sedative-hypnotic drugs, accidental (unintentional), initial encounter: Principal | ICD-10-CM

## 2012-01-15 DIAGNOSIS — J449 Chronic obstructive pulmonary disease, unspecified: Secondary | ICD-10-CM

## 2012-01-15 DIAGNOSIS — J9620 Acute and chronic respiratory failure, unspecified whether with hypoxia or hypercapnia: Secondary | ICD-10-CM

## 2012-01-15 DIAGNOSIS — T4271XA Poisoning by unspecified antiepileptic and sedative-hypnotic drugs, accidental (unintentional), initial encounter: Principal | ICD-10-CM

## 2012-01-15 LAB — BLOOD GAS, ARTERIAL
Acid-base deficit: 10 mmol/L — ABNORMAL HIGH (ref 0.0–2.0)
Bicarbonate: 19 mEq/L — ABNORMAL LOW (ref 20.0–24.0)
Drawn by: 36277
O2 Saturation: 96.5 %
PEEP: 5 cmH2O
PEEP: 5 cmH2O
Patient temperature: 98.6
Patient temperature: 98.6
TCO2: 20.6 mmol/L (ref 0–100)
TCO2: 17.6 mmol/L (ref 0–100)
pCO2 arterial: 41.9 mmHg (ref 35.0–45.0)
pH, Arterial: 7.215 — ABNORMAL LOW (ref 7.350–7.450)

## 2012-01-15 LAB — CBC
Hemoglobin: 9.7 g/dL — ABNORMAL LOW (ref 12.0–15.0)
MCHC: 32.4 g/dL (ref 30.0–36.0)
RBC: 3.25 MIL/uL — ABNORMAL LOW (ref 3.87–5.11)
WBC: 18.9 10*3/uL — ABNORMAL HIGH (ref 4.0–10.5)

## 2012-01-15 LAB — HEPARIN LEVEL (UNFRACTIONATED)
Heparin Unfractionated: 0.51 IU/mL (ref 0.30–0.70)
Heparin Unfractionated: 0.39 IU/mL (ref 0.30–0.70)

## 2012-01-15 LAB — GLUCOSE, CAPILLARY
Glucose-Capillary: 129 mg/dL — ABNORMAL HIGH (ref 70–99)
Glucose-Capillary: 138 mg/dL — ABNORMAL HIGH (ref 70–99)
Glucose-Capillary: 138 mg/dL — ABNORMAL HIGH (ref 70–99)
Glucose-Capillary: 145 mg/dL — ABNORMAL HIGH (ref 70–99)
Glucose-Capillary: 149 mg/dL — ABNORMAL HIGH (ref 70–99)
Glucose-Capillary: 158 mg/dL — ABNORMAL HIGH (ref 70–99)
Glucose-Capillary: 172 mg/dL — ABNORMAL HIGH (ref 70–99)
Glucose-Capillary: 182 mg/dL — ABNORMAL HIGH (ref 70–99)
Glucose-Capillary: 208 mg/dL — ABNORMAL HIGH (ref 70–99)
Glucose-Capillary: 365 mg/dL — ABNORMAL HIGH (ref 70–99)

## 2012-01-15 LAB — BASIC METABOLIC PANEL
GFR calc non Af Amer: >90 mL/min (ref >90)
Glucose, Bld: 214 mg/dL — ABNORMAL HIGH (ref 70–99)
Potassium: 4.3 mEq/L (ref 3.5–5.1)
Sodium: 132 mEq/L — ABNORMAL LOW (ref 135–145)

## 2012-01-15 MED ORDER — CHLORHEXIDINE GLUCONATE 0.12 % MT SOLN
15.0000 mL | Freq: Two times a day (BID) | OROMUCOSAL | Status: DC
  Administered 2012-01-15 – 2012-01-18 (×6): 15 mL via OROMUCOSAL
  Filled 2012-01-15 (×7): qty 15

## 2012-01-15 MED ORDER — INSULIN GLARGINE 100 UNIT/ML ~~LOC~~ SOLN
25.0000 [IU] | SUBCUTANEOUS | Status: DC
  Administered 2012-01-15: 25 [IU] via SUBCUTANEOUS

## 2012-01-15 MED ORDER — IOHEXOL 350 MG/ML SOLN
80.0000 mL | Freq: Once | INTRAVENOUS | Status: AC | PRN
  Administered 2012-01-15: 80 mL via INTRAVENOUS

## 2012-01-15 MED ORDER — CHLORHEXIDINE GLUCONATE 0.12 % MT SOLN
15.0000 mL | Freq: Two times a day (BID) | OROMUCOSAL | Status: DC
  Filled 2012-01-15: qty 15

## 2012-01-15 MED ORDER — DEXTROSE 10 % IV SOLN
INTRAVENOUS | Status: DC | PRN
  Administered 2012-01-16: 1000 mL via INTRAVENOUS

## 2012-01-15 MED ORDER — BIOTENE DRY MOUTH MT LIQD
15.0000 mL | Freq: Four times a day (QID) | OROMUCOSAL | Status: DC
  Administered 2012-01-15 – 2012-01-18 (×15): 15 mL via OROMUCOSAL

## 2012-01-15 MED ORDER — INSULIN ASPART 100 UNIT/ML ~~LOC~~ SOLN
2.0000 [IU] | SUBCUTANEOUS | Status: DC
  Administered 2012-01-15: 6 [IU] via SUBCUTANEOUS
  Administered 2012-01-15: 2 [IU] via SUBCUTANEOUS
  Administered 2012-01-15: 6 [IU] via SUBCUTANEOUS
  Administered 2012-01-16: 2 [IU] via SUBCUTANEOUS

## 2012-01-15 NOTE — Progress Notes (Signed)
Pos troponin   EKG ordered; cont q6h troponin

## 2012-01-15 NOTE — Progress Notes (Signed)
ANTICOAGULATION CONSULT NOTE - Follow Up Consult  Pharmacy Consult for heparin Indication: r/o PE  Labs:  Basename 01/15/12 1126 01/15/12 1000 01/15/12 0430 01/14/12 2220 01/14/12 1207  HGB -- -- 9.7* -- 9.7*  HCT -- -- 29.9* -- 30.4*  PLT -- -- 214 -- 158  APTT -- -- -- -- --  LABPROT -- -- -- -- --  INR -- -- -- -- --  HEPARINUNFRC -- 0.39 0.51 -- --  CREATININE -- -- 0.51 -- 0.67  CKTOTAL -- -- -- -- --  CKMB -- -- -- -- --  TROPONINI 0.49* -- 0.50* <0.30 --    Assessment/Plan:  52yo female therapeutic on heparin with initial dosing for possible PE, awaiting CT.  Will continue gtt at current rate. Will continue with daily heparin levels  Severiano Gilbert PharmD BCPS 01/15/2012,1:41 PM

## 2012-01-15 NOTE — Progress Notes (Signed)
ANTICOAGULATION CONSULT NOTE - Follow Up Consult  Pharmacy Consult for heparin Indication: r/o PE  Labs:  Basename 01/15/12 0430 01/14/12 2220 01/14/12 1639 01/14/12 1208 01/14/12 1207  HGB 9.7* -- -- -- 9.7*  HCT 29.9* -- -- -- 30.4*  PLT 214 -- -- -- 158  APTT -- -- -- -- --  LABPROT -- -- -- -- --  INR -- -- -- -- --  HEPARINUNFRC 0.51 -- -- -- --  CREATININE -- -- -- -- 0.67  CKTOTAL -- -- -- -- --  CKMB -- -- -- -- --  TROPONINI -- <0.30 <0.30 <0.30 --    Assessment/Plan:  52yo female therapeutic on heparin with initial dosing for possible PE, awaiting CT.  Will continue gtt at current rate and confirm stable with additional level.  Colleen Can PharmD BCPS 01/15/2012,5:00 AM

## 2012-01-15 NOTE — Consult Note (Signed)
WOC consult Note Reason for Consult: Consult requested for back wound. Wound type: Partial thickness, appears to be R/T abrasion.  Pt intubated and unable to discuss etiology. Pressure Ulcer POA: No Measurement: .5X.5X.1cm Wound bed: 100% red and moist Drainage (amount, consistency, odor) small yellow, no odor Periwound: erythremia surrounding wound 3X3cm Dressing procedure/placement/frequency: Foam dressing to protect and promote healing.  Pt in Sport low-air loss bed to reduce pressure. Will not plan to follow further unless re-consulted.  [address] RN, MSN, [employer]  [phone]

## 2012-01-15 NOTE — Progress Notes (Signed)
Inpatient Diabetes Program Recommendations  AACE/ADA: New Consensus Statement on Inpatient Glycemic Control (2013)  Target Ranges:  Prepandial:   less than 140 mg/dL      Peak postprandial:   less than 180 mg/dL (1-2 hours)      Critically ill patients:  140 - 180 mg/dL    Inpatient Diabetes Program Recommendations HgbA1C: order to assess prehospital glucose control  Will follow. Thank you  Shawnya Mayor RN,BSN,CDE Inpatient Diabetes Coordinator [phone]   

## 2012-01-15 NOTE — Progress Notes (Signed)
Severe asynchrony, breath stacking resulting in high PIP and air leak around the cuff. Would like to avoid sedation.  Changed to CPAP 5 PS 15 with VT around 700-900 mL, marked improvement in synchrony and no air leak.  Will check ABG in 1 hour.

## 2012-01-15 NOTE — Progress Notes (Signed)
Telephone call from Rosanne Ashing (boyfriend) with following info given to me. Jim filled pt rx on 1 Jan 14  Lisinopril 10mg  Q day/ 30 pills bottle empty  Oxycodone 5mg  Q 6 prn/ 30 pills bottle empty  Diltizam 30mg  TID/ 90 pills bottle empty

## 2012-01-15 NOTE — Progress Notes (Signed)
CRITICAL VALUE ALERT  Critical value received:  Trop 0.50  Date of notification:  01/15/2012  Time of notification:  0525  Critical value read back:yes  Nurse who received alert:  Tonette Lederer  Responding MD:  Leonie Douglas  Time MD responded:  [phone]

## 2012-01-15 NOTE — Progress Notes (Signed)
CRITICAL VALUE ALERT  Critical value received:  PH 7.18   Date of notification:  01/15/2012  Time of notification:  0430  Critical value read back:yes  Nurse who received alert:  Tonette Lederer  MD notified (1st page):  Elink-Dr Albon  Time of first page:  [phone]

## 2012-01-15 NOTE — Progress Notes (Signed)
VASCULAR LAB PRELIMINARY  PRELIMINARY  PRELIMINARY  PRELIMINARY  Bilateral lower extremity venous duplex  Preliminary report:  Bilateral:  No evidence of DVT, superficial thrombosis, or Baker's Cyst.   Caleb Decock, RVS 01/15/2012, 8:54 AM

## 2012-01-15 NOTE — Progress Notes (Signed)
Name: Natasha [name] MRN: [id number] DOB: Dec 21, 1960 LOS: 1  PCCM RESIDENT DAILY PROGRESS NOTE  History of Present Illness: 52 year old female, recently discharged from [facility] s/p prolonged hospital stay for acute on chronic respiratory failure in setting of AECOPD which required mechanical ventilation. She was chronically trach dependant prior to her most recent hospital stay. She was discharged to home from [facility] 01/10/12. Noted to be in usual State of health w/ no acute complaints as of 1/5. Per husband he had seen her in am hours, left to go to store and on return to home found her with decreased LOC. In ER she was found to be lethargic and hypotensive. She was given narcan bolus w/ intermittent response resulting in agitation. PCCM was asked to admit.   Lines / Drains: Trach #4 cuffed (Chronic) >>> 1/6 R IJ CVL:  1/6 R fem art line:   Cultures: Sputum 1/6>>> mod gram pos cocci in clusters, few GNR, few GPR UC 1/6>>>  BCX2 1/6>>>  Antibiotics: Zosyn 1/6 >>>   Tests / Events: UDS 1/6>>> Negative, home prescription was oxycodone Echo 1/6>>>  CT chest 1/6>>>  LE dopplers 1/6>>> Negative 1/6 admitted to ICU secondary to AMS likely secondary to narcotic overdose.  Responded well to narcan.  BP very soft and needing increased pressor support overnight.   Overnight Events: lots of vent asynchrony overnight, changed to CPAP with 15 of pressure support and tolerating that better.  Increased pressor support overnight, currently on 200 neo, 50 levo, and vasopressin with adequate BP.    Vital Signs: Temp:  [97.1 F (36.2 C)-98.8 F (37.1 C)] 98.3 F (36.8 C) (01/07 0800) Pulse Rate:  [65-90] 84  (01/07 0800) Resp:  [12-30] 13  (01/07 0800) BP: (53-86)/(26-49) 86/47 mmHg (01/06 2200) SpO2:  [88 %-100 %] 95 % (01/07 0800) Arterial Line BP: (64-114)/(39-57) 106/52 mmHg (01/07 0800) FiO2 (%):  [50 %-60 %] 50 % (01/07 0727) Weight:  [148 lb 13 oz (67.5 kg)] 148 lb 13 oz (67.5 kg) (01/06 1900) I/O  last 3 completed shifts: In: 22014.4 [I.V.:13714.4; IV Piggyback:8300] Out: 1355 [Urine:1355]  Physical Examination: Vitals reviewed. General: resting in bed, chronically ill appearing but NAD, chronic trach present.  Awake and alert.  HEENT: PERRL, EOMI, no scleral icterus Cardiac: RRR, no rubs, murmurs or gallops Pulm: clear to auscultation bilaterally, no wheezes, rales, or rhonchi Abd: soft, nontender, nondistended, BS present Ext: marked muscular wasting.  No edema.  Neuro: alert and responding to questions.  Moving all 4.   Ventilator settings: Vent Mode:  [-] CPAP;PSV FiO2 (%):  [50 %-60 %] 50 % Set Rate:  [20 bmp] 20 bmp Vt Set:  [450 mL] 450 mL PEEP:  [5 cmH20] 5 cmH20 Pressure Support:  [15 cmH20] 15 cmH20 Plateau Pressure:  [16 cmH20-20 cmH20] 20 cmH20  Basename 01/15/12 0601 01/15/12 0500 01/14/12 1732  PHART 7.215* 7.183* 7.225*  PO2ART 98.8 80.5 98.0  TCO2 17.6 20.6 25  HCO3 16.3* 19.0* 23.8   Labs and Imaging:   Basic Metabolic Panel:  Lab 01/15/12 4098 01/14/12 1207  NA 132* 135  K 4.3 3.3*  CL 104 98  CO2 19 28  GLUCOSE 214* 245*  BUN 6 5*  CREATININE 0.51 0.67  CALCIUM 8.0* 9.8  MG 1.5 --  PHOS 2.5 --   Liver Function Tests:  Lab 01/14/12 1207  AST 10  ALT 13  ALKPHOS 109  BILITOT 0.3  PROT 4.8*  ALBUMIN 2.0*    Lab 01/14/12 1207  LIPASE 48  AMYLASE --   CBC:  Lab 01/15/12 0430 01/14/12 1207  WBC 18.9* 9.7  NEUTROABS -- 8.4*  HGB 9.7* 9.7*  HCT 29.9* 30.4*  MCV 92.0 93.3  PLT 214 158   Cardiac Enzymes:  Lab 01/15/12 0430 01/14/12 2220 01/14/12 1639  CKTOTAL -- -- --  CKMB -- -- --  CKMBINDEX -- -- --  TROPONINI 0.50* <0.30 <0.30   CBG:  Lab 01/15/12 0807 01/15/12 0716 01/15/12 0615 01/15/12 0517 01/15/12 0413 01/15/12 0315  GLUCAP 138* 138* 159* 182* 190* 245*   Drugs of Abuse     Component Value Date/Time   LABOPIA NONE DETECTED 01/14/2012 1410   COCAINSCRNUR NONE DETECTED 01/14/2012 1410   LABBENZ NONE DETECTED  01/14/2012 1410   AMPHETMU NONE DETECTED 01/14/2012 1410   THCU NONE DETECTED 01/14/2012 1410   LABBARB NONE DETECTED 01/14/2012 1410    Alcohol Level:  Lab 01/14/12 1207  ETH <11   Urinalysis:  Lab 01/14/12 1410  COLORURINE YELLOW  LABSPEC 1.011  PHURINE 6.0  GLUCOSEU 500*  HGBUR NEGATIVE  BILIRUBINUR NEGATIVE  KETONESUR NEGATIVE  PROTEINUR NEGATIVE  UROBILINOGEN 0.2  NITRITE NEGATIVE  LEUKOCYTESUR NEGATIVE    Lab 01/15/12 0430 01/14/12 1640 01/14/12 1639 01/14/12 1205  LATICACIDVEN -- 2.5* -- 2.44*  PROCALCITON <0.10 -- <0.10 --   Assessment and Plan: PULMONARY  ASSESSMENT: 1. Acute on chronic hypercarbic respiratory failure: End stage with chronic trach in place.  Acute exacerbation likely secondary to narcotic overdose.  Doing better this AM from a breathing standpoint.  Still hypotensive today and Troponin rising slightly.  EKG unchanged, PE is in the differential CT scan pending.  Lower extremity dopplers are negative.  2. Severe COPD:  Tracheal aspirate has a very dirty gram stain.  A febrile but has a WBC count this AM.   3. Tracheostomy dependent:  Improved but markedly hypotensive today.    PLAN:   Continue CPA support for now Minimal Sedation as needed, avoid opiates for now.  F/u cultures  CARDIOVASCULAR  ASSESSMENT:  1. Shock, secondary to cardiovascular vs distributative: Received 4L of fluid and still on 3 pressors to maintain MAP >65.  No fever but new increased WBC count  2. Elevated troponins: Troponin this morning is 0.5.  EKG is unchanged with no ST elevations, Q waves, or T wave inversions.    PLAN:  - Stress dose steroids - continue pressors for MAP of >60 - cycle Trops - CT chest to r/o PE, - 2D echo pending - hold hypertensives  RENAL  ASSESSMENT:   1. Hypokalemia:  Resolved 2. Hyponatremia:  Likely from fluid hydration  PLAN:   Follow UOP and monitor electrolytes  GASTROINTESTINAL  ASSESSMENT:  none PLAN:    none  HEMATOLOGIC  ASSESSMENT:  Chronic normocytic anemia: no evidence of bleeding.  PLAN:  Monitor CBCs   INFECTIOUS  ASSESSMENT:  No active site of infection.   PLAN:   F/u on cultures  ENDOCRINE  ASSESSMENT:  Presumed adrenal insuffiencey:   PLAN:   - Stress dose steroids.   NEUROLOGIC  ASSESSMENT:  Acute encephalopathy:  Resolving likely secondary to increased hypercabia and residual narcotic effect.  PLAN:   Supportive care CT head.  CLINICAL SUMMARY: 52 year old chronically ill woman presents with acute encephalopathy likely secondary to narcotic overdose.  AMS resolved.  Now with pressor dependency likely secondary to adrenal insuffiencey vs cardiogenic shock.  Plan for CT of chest and head today as well as 2d echo.  Will need to monitor Trop for rise though EKG unremarkable.    Best practices / Disposition: -->ICU status under PCCM -->full code -->Heparin for DVT Px -->Protonix for GI Px -->ventilator bundle -->diet -->family updated at bedside  PRIBULA,CHRISTOPHER 01/15/2012, 9:37 AM    I have interviewed and examined the patient and reviewed the database. I have formulated the assessment and plan as reflected in the note above with amendments made by me. 40 mins of direct critical care time provided  Billy Fischer, MD;  PCCM service; Mobile [phone]

## 2012-01-15 NOTE — Progress Notes (Signed)
INITIAL NUTRITION ASSESSMENT  DOCUMENTATION CODES Per approved criteria  -Not Applicable   INTERVENTION: 1. Recommend initiation of EN. Recommend Osmolite 1.2 initiate at 20 ml/hr and advance by 10 ml q 4 hr to a goal rate of 40 ml/hr, 30 ml Pro-stat QID. This EN regimen will provide 1552 kcal, 113 gm protein, and 787 ml free water.  2. RD will continue to follow    NUTRITION DIAGNOSIS: Inadequate oral intake related to inability to eat as evidenced by NPO.   Goal: Meet >/=90% estimated nutrition needs.   Monitor:  Vent status, weight trends, I/O's, labs, initiation of EN  Reason for Assessment: new Vent  52 y.o. female  Admitting Dx: Acute encephalopathy  ASSESSMENT: Pt recently d/c'd from [facility] on 1/2, previously trach dependant. Admitted with AMS likely 2/2 to narcotic overdose. On vent support at this time, no TF at this time.  Tube in place to suction. Does not appear to have a PEG per visual exam with RN. RD attempted to contact RD at [facility] but was unable, will continue to try and figure out if pt was being fed via a tube or other means.   Height: Ht Readings from Last 1 Encounters:  01/14/12 5\' 6"  (1.676 m)    Weight: Wt Readings from Last 1 Encounters:  01/14/12 148 lb 13 oz (67.5 kg)    Ideal Body Weight: 130 lbs   % Ideal Body Weight: 114%  Wt Readings from Last 10 Encounters:  01/14/12 148 lb 13 oz (67.5 kg)    Usual Body Weight: no weight hx on file   % Usual Body Weight: --  BMI:  Body mass index is 24.02 kg/(m^2). WNL  Patient is currently intubated on ventilator support.  MV: 9.3 Temp:Temp (24hrs), Avg:97.9 F (36.6 C), Min:97.1 F (36.2 C), Max:98.8 F (37.1 C)  Propofol: none   Estimated Nutritional Needs: Kcal: 1541 Protein: 105-120 gm  Fluid: 1.5 L  Skin: abrasion on back, partial thickness loss  Diet Order: NPO  EDUCATION NEEDS: -No education needs identified at this time   Intake/Output Summary (Last 24 hours) at 01/15/12  1015 Last data filed at 01/15/12 0800  Gross per 24 hour  Intake 22278.03 ml  Output   1530 ml  Net 20748.03 ml    Last BM: PTA   Labs:   Lab 01/15/12 0430 01/14/12 1207  NA 132* 135  K 4.3 3.3*  CL 104 98  CO2 19 28  BUN 6 5*  CREATININE 0.51 0.67  CALCIUM 8.0* 9.8  MG 1.5 --  PHOS 2.5 --  GLUCOSE 214* 245*    CBG (last 3)   Basename 01/15/12 0807 01/15/12 0716 01/15/12 0615  GLUCAP 138* 138* 159*    Scheduled Meds:   . albuterol  4 puff Inhalation Q3H  . antiseptic oral rinse  15 mL Mouth Rinse QID  . chlorhexidine  15 mL Mouth Rinse BID  . hydrocortisone sod succinate (SOLU-CORTEF) injection  50 mg Intravenous Q6H  . ipratropium  4 puff Inhalation Q6H  . pantoprazole (PROTONIX) IV  40 mg Intravenous QHS  . piperacillin-tazobactam (ZOSYN)  IV  3.375 g Intravenous Q8H    Continuous Infusions:   . sodium chloride 100 mL/hr at 01/14/12 2130  . heparin 1,100 Units/hr (01/14/12 2159)  . insulin (NOVOLIN-R) infusion 7.9 Units/hr (01/15/12 0616)  . norepinephrine (LEVOPHED) Adult infusion 50 mcg/min (01/15/12 0800)  . phenylephrine (NEO-SYNEPHRINE) Adult infusion 200 mcg/min (01/15/12 0537)  . vasopressin (PITRESSIN) infusion - *FOR SHOCK* 0.03  Units/min (01/14/12 1930)    Past Medical History  Diagnosis Date  . Mental disorder     bipolar  . COPD (chronic obstructive pulmonary disease)   . Shortness of breath   . Diabetes mellitus without complication     History reviewed. No pertinent past surgical history.  Clarene Duke RD, LDN Pager [phone] After Hours pager [phone]

## 2012-01-16 DIAGNOSIS — I959 Hypotension, unspecified: Secondary | ICD-10-CM

## 2012-01-16 LAB — BASIC METABOLIC PANEL
BUN: 4 mg/dL — ABNORMAL LOW (ref 6–23)
CO2: 25 mEq/L (ref 19–32)
Calcium: 8.4 mg/dL (ref 8.4–10.5)
GFR calc non Af Amer: >90 mL/min (ref >90)
Glucose, Bld: 128 mg/dL — ABNORMAL HIGH (ref 70–99)
Sodium: 128 mEq/L — ABNORMAL LOW (ref 135–145)

## 2012-01-16 LAB — CBC
HCT: 27.5 % — ABNORMAL LOW (ref 36.0–46.0)
Hemoglobin: 9.3 g/dL — ABNORMAL LOW (ref 12.0–15.0)
MCH: 30.2 pg (ref 26.0–34.0)
MCHC: 33.8 g/dL (ref 30.0–36.0)
RBC: 3.08 MIL/uL — ABNORMAL LOW (ref 3.87–5.11)

## 2012-01-16 LAB — GLUCOSE, CAPILLARY
Glucose-Capillary: 133 mg/dL — ABNORMAL HIGH (ref 70–99)
Glucose-Capillary: 65 mg/dL — ABNORMAL LOW (ref 70–99)
Glucose-Capillary: 53 mg/dL — ABNORMAL LOW (ref 70–99)

## 2012-01-16 LAB — URINE CULTURE: Colony Count: >=100000

## 2012-01-16 LAB — TROPONIN I: Troponin I: <0.3 ng/mL (ref <0.30)

## 2012-01-16 LAB — LEGIONELLA ANTIGEN, URINE

## 2012-01-16 LAB — PROCALCITONIN: Procalcitonin: <0.1 ng/mL

## 2012-01-16 MED ORDER — FUROSEMIDE 10 MG/ML IJ SOLN
40.0000 mg | Freq: Once | INTRAMUSCULAR | Status: AC
  Administered 2012-01-16: 40 mg via INTRAVENOUS
  Filled 2012-01-16: qty 4

## 2012-01-16 MED ORDER — DEXTROSE 10 % IV SOLN
INTRAVENOUS | Status: DC
  Administered 2012-01-16: 1000 mL via INTRAVENOUS

## 2012-01-16 MED ORDER — POTASSIUM CHLORIDE 20 MEQ/15ML (10%) PO LIQD
ORAL | Status: AC
  Administered 2012-01-16: 40 meq via ORAL
  Filled 2012-01-16: qty 30

## 2012-01-16 MED ORDER — DEXTROSE 50 % IV SOLN
INTRAVENOUS | Status: AC
  Administered 2012-01-16: 50 mL
  Filled 2012-01-16: qty 50

## 2012-01-16 MED ORDER — POTASSIUM CHLORIDE 20 MEQ/15ML (10%) PO LIQD
40.0000 meq | Freq: Two times a day (BID) | ORAL | Status: DC
  Administered 2012-01-16 – 2012-01-17 (×3): 40 meq via ORAL
  Filled 2012-01-16 (×4): qty 30

## 2012-01-16 NOTE — Progress Notes (Signed)
CRITICAL VALUE ALERT  Critical value received:  CBG=53 mg/dl  Date of notification:  01/16/2012   Time of notification:  1704  Critical value read backyes  Nurse who received alert:  tata Asencion Partridge  MD notified (1st page):  [phone]  Time of first page: 1704  MD notified (2nd page):  Time of second page:  Responding MD:    Time MD responded:

## 2012-01-16 NOTE — Progress Notes (Signed)
Orders received. Patient extubated late this am after 48 hour intubation. Will f/u 1/9 to complete evaluation.   Ferdinand Lango MA, CCC-SLP [phone]

## 2012-01-16 NOTE — Progress Notes (Signed)
Per MD, ok to discontinue safety/suicide sitter at bedside.  Will monitor patient closely.

## 2012-01-16 NOTE — Progress Notes (Signed)
Pt removed from Vent and place on 50% ATC. Tolerating well at this time.  RT will continue to monitor.

## 2012-01-16 NOTE — Progress Notes (Signed)
Notified Dr.Wright of Pt HR 110s to 120s. No new orders received.

## 2012-01-16 NOTE — Progress Notes (Signed)
@  2030, entered room to find pt drinking Mountain Dew.125 cc intake.  Pt made aware of NPO status at shift change. Removed the drinks from room.

## 2012-01-16 NOTE — Progress Notes (Signed)
Name: Natasha [name] MRN: [id number] DOB: February 16, 1960 LOS: 2  PCCM RESIDENT DAILY PROGRESS NOTE  History of Present Illness: 52 year old female, recently discharged from [facility] s/p prolonged hospital stay for acute on chronic respiratory failure in setting of AECOPD which required mechanical ventilation. She was chronically trach dependant prior to her most recent hospital stay. She was discharged to home from [facility] 01/10/12. Noted to be in usual State of health w/ no acute complaints as of 1/5. Per husband he had seen her in am hours, left to go to store and on return to home found her with decreased LOC. In ER she was found to be lethargic and hypotensive. She was given narcan bolus w/ intermittent response resulting in agitation. PCCM was asked to admit.   Lines / Drains: Trach #4 cuffed (Chronic) >>> 1/6 R IJ CVL:  1/6 R fem art line:   Cultures: Sputum 1/6>>> mod gram pos cocci in clusters, few GNR, few GPR UC 1/6>>> >100k Entrococcus BCX2 1/6>>> NGTD  Antibiotics: Zosyn 1/6 >>>  Tests / Events: UDS 1/6>>> Negative, home prescription was oxycodone Echo 1/6>>>  CT chest 1/6>>>  LE dopplers 1/6>>> Negative 1/6 admitted to ICU secondary to AMS likely secondary to narcotic overdose.  Responded well to narcan.  BP very soft and needing increased pressor support overnight.   Overnight Events: able to taper off pressors yesterday.  Per husband's phone call he found several bottles that were filled on 1/3 when she was discharged that were empty way to early including lisinopril, diltiazem, and oxycodone.  Sitter at bedside.  Patient denies she tried to hurt herself.  Very dramatic with nursing staff yesterday.    Vital Signs: Temp:  [97.6 F (36.4 C)-98.9 F (37.2 C)] 98.9 F (37.2 C) (01/08 0743) Pulse Rate:  [64-95] 87  (01/08 0821) Resp:  [15-26] 20  (01/08 0821) BP: (117)/(56) 117/56 mmHg (01/07 1205) SpO2:  [91 %-100 %] 99 % (01/08 0821) Arterial Line BP: (95-130)/(47-66) 130/65 mmHg  (01/08 0800) FiO2 (%):  [50 %-60 %] 50 % (01/08 0821) Weight:  [164 lb 7.4 oz (74.6 kg)] 164 lb 7.4 oz (74.6 kg) (01/08 0500) I/O last 3 completed shifts: In: 11186.5 [I.V.:9786.5; IV Piggyback:1400] Out: 3770 [Urine:3770]  Physical Examination: Vitals reviewed. General: resting in bed, chronically ill appearing but NAD, chronic trach present.  Awake and alert and interactive.  HEENT: PERRL, EOMI, no scleral icterus Cardiac: RRR, no rubs, murmurs or gallops Pulm: clear to auscultation bilaterally, no wheezes, rales, or rhonchi Abd: soft, nontender, nondistended, BS present Ext: marked muscular wasting.  No edema.  Neuro: alert and responding to questions.  Moving all 4.   Ventilator settings: Vent Mode:  [-] CPAP;PSV FiO2 (%):  [50 %-60 %] 50 % PEEP:  [5 cmH20] 5 cmH20 Pressure Support:  [8 cmH20-12 cmH20] 12 cmH20  Basename 01/15/12 0601 01/15/12 0500 01/14/12 1732  PHART 7.215* 7.183* 7.225*  PO2ART 98.8 80.5 98.0  TCO2 17.6 20.6 25  HCO3 16.3* 19.0* 23.8   Labs and Imaging:   Basic Metabolic Panel:  Lab 01/16/12 1478 01/15/12 0430  NA 128* 132*  K 3.5 4.3  CL 95* 104  CO2 25 19  GLUCOSE 128* 214*  BUN 4* 6  CREATININE 0.38* 0.51  CALCIUM 8.4 8.0*  MG -- 1.5  PHOS -- 2.5   Liver Function Tests:  Lab 01/14/12 1207  AST 10  ALT 13  ALKPHOS 109  BILITOT 0.3  PROT 4.8*  ALBUMIN 2.0*    Lab  01/14/12 1207  LIPASE 48  AMYLASE --   CBC:  Lab 01/16/12 0435 01/15/12 0430 01/14/12 1207  WBC 13.3* 18.9* --  NEUTROABS -- -- 8.4*  HGB 9.3* 9.7* --  HCT 27.5* 29.9* --  MCV 89.3 92.0 --  PLT 179 214 --   Cardiac Enzymes:  Lab 01/15/12 2340 01/15/12 1740 01/15/12 1126  CKTOTAL -- -- --  CKMB -- -- --  CKMBINDEX -- -- --  TROPONINI <0.30 <0.30 0.49*   CBG:  Lab 01/16/12 0739 01/16/12 0415 01/15/12 2349 01/15/12 2009 01/15/12 1656 01/15/12 1553  GLUCAP 65* 133* 208* 228* 149* 158*   Drugs of Abuse     Component Value Date/Time   LABOPIA NONE  DETECTED 01/14/2012 1410   COCAINSCRNUR NONE DETECTED 01/14/2012 1410   LABBENZ NONE DETECTED 01/14/2012 1410   AMPHETMU NONE DETECTED 01/14/2012 1410   THCU NONE DETECTED 01/14/2012 1410   LABBARB NONE DETECTED 01/14/2012 1410    Alcohol Level:  Lab 01/14/12 1207  ETH <11   Urinalysis:  Lab 01/14/12 1410  COLORURINE YELLOW  LABSPEC 1.011  PHURINE 6.0  GLUCOSEU 500*  HGBUR NEGATIVE  BILIRUBINUR NEGATIVE  KETONESUR NEGATIVE  PROTEINUR NEGATIVE  UROBILINOGEN 0.2  NITRITE NEGATIVE  LEUKOCYTESUR NEGATIVE    Lab 01/16/12 0435 01/15/12 0430 01/14/12 1640 01/14/12 1639 01/14/12 1205  LATICACIDVEN -- -- 2.5* -- 2.44*  PROCALCITON <0.10 <0.10 -- <0.10 --   Assessment and Plan: PULMONARY  ASSESSMENT: 1. Acute on chronic hypercarbic respiratory failure: End stage with chronic trach in place.  Acute exacerbation likely secondary to narcotic overdose.  Doing better this AM from a breathing standpoint.  Hypotension much improved today and able to wean off pressors yesterday.  Lower extremity dopplers are negative and CT negative for PE.   2. Severe COPD:  Tracheal aspirate has a very dirty gram stain.  Afebrile and WBC trending downward today.  3. Tracheostomy dependent:  Improved  PLAN:   Trach collar trial today.  Minimal Sedation as needed, avoid opiates for now.  F/u cultures  CARDIOVASCULAR  ASSESSMENT:  1. Shock, secondary to cardiovascular vs distributative: Received 4L of fluid and still on 3 pressors to maintain MAP >65.  No fever and WBC trending downward.  Shock resolving and off pressors today.  Patient's husband reports that her lisinopril and diltiazem are empty about 20 days earlier. CT showed some fluid overload but no PE.  Will give 1 dose of lasix and potassium today.   2. Elevated troponins: Troponin peaked at 0.5 and are now down to negative.  EKG is unchanged with no ST elevations, Q waves, or T wave inversions.    PLAN:  - Lasix 40 mg IV once - KCL 40 BID - d/C  stress dose steroids.  - D/C pressors - 2D echo pending - hold hypertensives  RENAL  ASSESSMENT:   1. Hypokalemia:  Resolved 2. Hyponatremia:  Likely from fluid hydration  PLAN:   Follow UOP and monitor electrolytes  GASTROINTESTINAL  ASSESSMENT:  none PLAN:   none  HEMATOLOGIC  ASSESSMENT:  Chronic normocytic anemia: no evidence of bleeding.  PLAN:  Monitor CBCs   INFECTIOUS  ASSESSMENT:  No active site of infection.   PLAN:   F/u on cultures  ENDOCRINE  ASSESSMENT:  Presumed adrenal insuffiencey:   PLAN:   - Stress dose steroids.   NEUROLOGIC  ASSESSMENT:  Acute encephalopathy:  Resolving likely secondary to increased hypercabia and residual narcotic effect. CT head negative for acute infarction.   PLAN:  Supportive care  CLINICAL SUMMARY: 52 year old chronically ill woman presents with acute encephalopathy likely secondary to narcotic overdose.  AMS resolved.  Shock resolving, some question that she took more then prescribed of her blood pressure.  Troponins have trended back down so MI ruled out.  2D echo still pending but likely secondary to overdose of her medications.  Best practices / Disposition: ---> to SDU today.  --->full code -->Heparin for DVT Px -->diet -->family updated at bedside  PRIBULA,CHRISTOPHER 01/16/2012, 8:54 AM   I have interviewed and examined the patient and reviewed the database. I have formulated the assessment and plan as reflected in the note above with amendments made by me.   Billy Fischer, MD;  PCCM service; Mobile [phone]

## 2012-01-16 NOTE — Progress Notes (Signed)
Pt off insulin gtt since 1700. Pt with 2 CBG >200, MD notified and no new orders received at this time.

## 2012-01-16 NOTE — Progress Notes (Signed)
Dr. Delford Field called me back at 1706 with new orders.  Lantus is discontinued.  D10 Water infusing.  Will rechecked blood sugar in 30 minutes s/p D50 admin.

## 2012-01-17 LAB — GLUCOSE, CAPILLARY: Glucose-Capillary: 244 mg/dL — ABNORMAL HIGH (ref 70–99)

## 2012-01-17 MED ORDER — ALBUTEROL SULFATE (5 MG/ML) 0.5% IN NEBU
2.5000 mg | INHALATION_SOLUTION | Freq: Four times a day (QID) | RESPIRATORY_TRACT | Status: DC
  Administered 2012-01-17 – 2012-01-18 (×4): 2.5 mg via RESPIRATORY_TRACT
  Filled 2012-01-17 (×4): qty 0.5

## 2012-01-17 MED ORDER — DOXYCYCLINE HYCLATE 100 MG PO TABS
100.0000 mg | ORAL_TABLET | Freq: Two times a day (BID) | ORAL | Status: DC
  Administered 2012-01-17 – 2012-01-18 (×2): 100 mg via ORAL
  Filled 2012-01-17 (×5): qty 1

## 2012-01-17 MED ORDER — FAMOTIDINE 20 MG PO TABS
20.0000 mg | ORAL_TABLET | Freq: Two times a day (BID) | ORAL | Status: DC
  Administered 2012-01-17 – 2012-01-18 (×3): 20 mg via ORAL
  Filled 2012-01-17 (×4): qty 1

## 2012-01-17 MED ORDER — DILTIAZEM HCL 30 MG PO TABS
30.0000 mg | ORAL_TABLET | Freq: Three times a day (TID) | ORAL | Status: DC
  Administered 2012-01-17 – 2012-01-18 (×3): 30 mg via ORAL
  Filled 2012-01-17 (×5): qty 1

## 2012-01-17 MED ORDER — TRAZODONE HCL 100 MG PO TABS
100.0000 mg | ORAL_TABLET | Freq: Every evening | ORAL | Status: DC | PRN
  Filled 2012-01-17: qty 1

## 2012-01-17 MED ORDER — OXYCODONE HCL 5 MG PO TABS: 5.0000 mg | ORAL_TABLET | Freq: Four times a day (QID) | ORAL | Status: DC | PRN

## 2012-01-17 MED ORDER — GLIMEPIRIDE 1 MG PO TABS
1.0000 mg | ORAL_TABLET | Freq: Two times a day (BID) | ORAL | Status: DC
  Administered 2012-01-17 – 2012-01-18 (×2): 1 mg via ORAL
  Filled 2012-01-17 (×4): qty 1

## 2012-01-17 MED ORDER — IPRATROPIUM BROMIDE 0.02 % IN SOLN
0.5000 mg | Freq: Four times a day (QID) | RESPIRATORY_TRACT | Status: DC
  Administered 2012-01-17 – 2012-01-18 (×4): 0.5 mg via RESPIRATORY_TRACT
  Filled 2012-01-17 (×4): qty 2.5

## 2012-01-17 MED ORDER — ALBUTEROL SULFATE (5 MG/ML) 0.5% IN NEBU
2.5000 mg | INHALATION_SOLUTION | RESPIRATORY_TRACT | Status: DC | PRN
  Administered 2012-01-18: 2.5 mg via RESPIRATORY_TRACT
  Filled 2012-01-17: qty 0.5

## 2012-01-17 NOTE — Progress Notes (Signed)
Inpatient Diabetes Program Recommendations  AACE/ADA: New Consensus Statement on Inpatient Glycemic Control (2013)  Target Ranges:  Prepandial:   less than 140 mg/dL      Peak postprandial:   less than 180 mg/dL (1-2 hours)      Critically ill patients:  140 - 180 mg/dL   Please continue to monitor CBGs as patient does have a history of diabetes.    Inpatient Diabetes Program Recommendations HgbA1C: =7.6  Thank you  Piedad Climes BSN, RN,CDE Inpatient Diabetes Coordinator [phone] (team pager)

## 2012-01-17 NOTE — Progress Notes (Signed)
Patient sinus tach 120's, BP 150/98. Trach collar 35% with O2 sats 92-93%. Patient alert and oriented and denies pain. Elink notified and no new orders received.

## 2012-01-17 NOTE — Progress Notes (Signed)
Name: Natasha [name] MRN: [id number] DOB: 04/24/1960 LOS: 3  PCCM RESIDENT DAILY PROGRESS NOTE  History of Present Illness: 52 year old female, recently discharged from [facility] s/p prolonged hospital stay for acute on chronic respiratory failure in setting of AECOPD which required mechanical ventilation. She was chronically trach dependant prior to her most recent hospital stay. She was discharged to home from [facility] 01/10/12. Noted to be in usual State of health w/ no acute complaints as of 1/5. Per husband he had seen her in am hours, left to go to store and on return to home found her with decreased LOC. In ER she was found to be lethargic and hypotensive. She was given narcan bolus w/ intermittent response resulting in agitation. PCCM was asked to admit.   Lines / Drains: Trach #4 cuffed (Chronic) >>> 1/6 R IJ CVL >> 1/09 1/6 R fem art line >> 1/08  Cultures: Sputum 1/6>> abundant staph UC 1/6>>> >100k Entrococcus BCX2 1/6>>> NGTD  Antibiotics: Zosyn 1/6 >> 1/08 Doxy 1/09 >>   Tests / Events: UDS negative CT chest 1/6: No PE LE dopplers 1/6>>> Negative   SUBJ: RASS 0. No new complaints. No distress. Fully conversant. Oriented. Purulent resp secretions   Vital Signs: Temp:  [98.6 F (37 C)-99.3 F (37.4 C)] 99.3 F (37.4 C) (01/09 1954) Pulse Rate:  [110-136] 110  (01/09 2000) Resp:  [12-28] 14  (01/09 2000) BP: (102-150)/(51-98) 150/98 mmHg (01/09 2000) SpO2:  [91 %-100 %] 93 % (01/09 2000) FiO2 (%):  [35 %-40 %] 35 % (01/09 2000) Weight:  [70.3 kg (154 lb 15.7 oz)] 70.3 kg (154 lb 15.7 oz) (01/09 0450) I/O last 3 completed shifts: In: 452 [I.V.:372; Other:80] Out: 7052 [Urine:7050; Stool:2]  Physical Examination: General: NAD, chronic trach present.  Awake and alert and interactive.  HEENT: WNL Cardiac: RRR Pulm: clear to auscultation bilaterally, no wheezes, rales, or rhonchi Abd: soft, nontender, nondistended, BS present Ext: marked muscular wasting.  No edema.  Neuro:  alert and responding to questions.  Moving all 4.   Ventilator settings: Vent Mode:  [-]  FiO2 (%):  [35 %-40 %] 35 %  Basename 01/15/12 0601 01/15/12 0500  PHART 7.215* 7.183*  PO2ART 98.8 80.5  TCO2 17.6 20.6  HCO3 16.3* 19.0*   Labs and Imaging:   Basic Metabolic Panel:  Lab 01/16/12 4098 01/15/12 0430  NA 128* 132*  K 3.5 4.3  CL 95* 104  CO2 25 19  GLUCOSE 128* 214*  BUN 4* 6  CREATININE 0.38* 0.51  CALCIUM 8.4 8.0*  MG -- 1.5  PHOS -- 2.5   Liver Function Tests:  Lab 01/14/12 1207  AST 10  ALT 13  ALKPHOS 109  BILITOT 0.3  PROT 4.8*  ALBUMIN 2.0*    Lab 01/14/12 1207  LIPASE 48  AMYLASE --   CBC:  Lab 01/16/12 0435 01/15/12 0430 01/14/12 1207  WBC 13.3* 18.9* --  NEUTROABS -- -- 8.4*  HGB 9.3* 9.7* --  HCT 27.5* 29.9* --  MCV 89.3 92.0 --  PLT 179 214 --   Cardiac Enzymes:  Lab 01/15/12 2340 01/15/12 1740 01/15/12 1126  CKTOTAL -- -- --  CKMB -- -- --  CKMBINDEX -- -- --  TROPONINI <0.30 <0.30 0.49*   CBG:  Lab 01/17/12 1157 01/16/12 1842 01/16/12 1604 01/16/12 1139 01/16/12 0810 01/16/12 0739  GLUCAP 244* 77 53* 71 113* 65*   Drugs of Abuse     Component Value Date/Time   LABOPIA NONE DETECTED 01/14/2012  1410   COCAINSCRNUR NONE DETECTED 01/14/2012 1410   LABBENZ NONE DETECTED 01/14/2012 1410   AMPHETMU NONE DETECTED 01/14/2012 1410   THCU NONE DETECTED 01/14/2012 1410   LABBARB NONE DETECTED 01/14/2012 1410    Alcohol Level:  Lab 01/14/12 1207  ETH <11   Urinalysis:  Lab 01/14/12 1410  COLORURINE YELLOW  LABSPEC 1.011  PHURINE 6.0  GLUCOSEU 500*  HGBUR NEGATIVE  BILIRUBINUR NEGATIVE  KETONESUR NEGATIVE  PROTEINUR NEGATIVE  UROBILINOGEN 0.2  NITRITE NEGATIVE  LEUKOCYTESUR NEGATIVE    Lab 01/16/12 0435 01/15/12 0430 01/14/12 1640 01/14/12 1639 01/14/12 1205  LATICACIDVEN -- -- 2.5* -- 2.44*  PROCALCITON <0.10 <0.10 -- <0.10 --   Assessment and Plan: PULMONARY  ASSESSMENT: 1. Acute on chronic hypercarbic respiratory  failure: End stage with chronic trach in place.  Acute exacerbation likely secondary to narcotic overdose.  Doing better this AM from a breathing standpoint.  Hypotension much improved today and able to wean off pressors yesterday.  Lower extremity dopplers are negative and CT negative for PE.   2. Acute bronchitis:  Purulent secretions, staph on cx 3. Tracheostomy dependent  PLAN:   Cont ATC Doxycycline X 10 days   CARDIOVASCULAR  ASSESSMENT:  1. Shock, resolved: etiology never fully discerned 2. Minimally elevated troponins: nonspecific. No further eval indicated @ this time  PLAN:  Cont current rx   RENAL  ASSESSMENT:   1. Hypokalemia:  Resolved 2. Hyponatremia  PLAN:   Monitor as needed   HEMATOLOGIC  ASSESSMENT:  Chronic normocytic anemia: no evidence of bleeding.  PLAN:  Monitor CBCs    NEUROLOGIC  ASSESSMENT:  Acute encephalopathy:  Resolved. Presumed due to meds.   PLAN:   Meds adjusted   Anticipate DC home 1/10   Billy Fischer, MD;  PCCM service; Mobile [phone]

## 2012-01-17 NOTE — Evaluation (Signed)
Clinical/Bedside Swallow Evaluation Patient Details  Name: Natasha [name] MRN: [id number] Date of Birth: October 19, 1960  Today's Date: 01/17/2012 Time:  -     Past Medical History:  Past Medical History  Diagnosis Date  . Mental disorder     bipolar  . COPD (chronic obstructive pulmonary disease)   . Shortness of breath   . Diabetes mellitus without complication    Past Surgical History: History reviewed. No pertinent past surgical history. HPI:  52 yr old admitted with AMS and hypotension.  Pt. recently d/c from [facility] on 1/2 s/p prolonged stay for vent weaning in setting of Acute on Chronic respiratory failure due to AECOPD.  Pt. has trach for 2 years (# 4 cuff, deflated).  Intubated 1/6-1/8.  Additional PMH:  COPD, ARF, DM2, bipolar disorder.  CXR 1/6 with small pleural effusion and edema compatible with fluid overload (CHF).  Pt. has Passy-Muir Speaking valve from [facility].    Assessment / Plan / Recommendation Clinical Impression  Pt. seen for bedside swallow evaluation with Passy-Muir speaking valve in placed (from previous admission).  Oral prep, mastication and transit of regular texture bolus was WFL's.  Pt. exhibited immediate (mild) throat clears with 2 of 4 trials graham cracker possibly due to attempting to verbalize x 1 and possible penetration/dry texture x 1.  Thin liquids were consistently consumed without difficulty via cup and straw.  Recommend pt. initiate a regular texute diet and thin liquids, straws permitted, meds whole in applesauce.   SLP will follow up briefly to further assess safety and efficiency with recommendations.    Aspiration Risk  Mild    Diet Recommendation Regular;Thin liquid   Liquid Administration via: Cup;Straw Medication Administration: Whole meds with puree Supervision: Patient able to self feed;Intermittent supervision to cue for compensatory strategies Compensations: Slow rate;Small sips/bites Postural Changes and/or Swallow Maneuvers: Seated  upright 90 degrees (WEAR SPEAKING VALVE DURING MEALS)    Other  Recommendations Oral Care Recommendations: Oral care BID   Follow Up Recommendations   (TBD)    Frequency and Duration min 2x/week  1 week       SLP Swallow Goals Patient will utilize recommended strategies during swallow to increase swallowing safety with: Minimal cueing   Swallow Study Prior Functional Status       General HPI: 52 yr old admitted with AMS and hypotension.  Pt. recently d/c from [facility] on 1/2 s/p prolonged stay for vent weaning in setting of Acute on Chronic respiratory failure due to AECOPD.  Pt. has trach for 2 years (# 4 cuff, deflated).  Intubated 1/6-1/8.  Additional PMH:  COPD, ARF, DM2, bipolar disorder.  CXR 1/6 with small pleural effusion and edema compatible with fluid overload (CHF).  Pt. has Passy-Muir Speaking valve from [facility].  Respiratory Status: Trach Trach Size and Type: Cuff;#4;With PMSV in place History of Recent Intubation: Yes Length of Intubations (days):  (2) Date extubated: 01/16/12 Behavior/Cognition: Alert;Cooperative;Pleasant mood Oral Cavity - Dentition: Dentures, top (4 lower teeth) Baseline Vocal Quality: Clear Volitional Cough: Weak (mildly weak) Volitional Swallow: Able to elicit    Oral/Motor/Sensory Function Overall Oral Motor/Sensory Function: Appears within functional limits for tasks assessed Velum: Within Functional Limits   Ice Chips Ice chips: Not tested   Thin Liquid Thin Liquid: Within functional limits Presentation: Cup;Self Fed;Straw    Nectar Thick Nectar Thick Liquid: Not tested   Honey Thick Honey Thick Liquid: Not tested   Puree Puree: Within functional limits Presentation: Self Fed;Spoon   Solid  GO    Solid: Impaired Pharyngeal Phase Impairments: Throat Clearing - Immediate       Royce Macadamia M.Ed [employer] [phone]  01/17/2012

## 2012-01-17 NOTE — Progress Notes (Signed)
  Echocardiogram 2D Echocardiogram has been performed. Limited study completed; patient refused to finish exam halfway through study.    Georgian Co 01/17/2012, 2:46 PM

## 2012-01-17 NOTE — Progress Notes (Signed)
eLink Physician-Brief Progress Note Patient Name: Natasha [name] DOB: 1960-05-03 MRN: [id number]  Date of Service  01/17/2012   HPI/Events of Note   Cx data reviewed - new result shows abundant S. Aureus from tracheal aspirate collected 1/6 (sens pending). Also urine cx with enterococcus (R to tet).   eICU Interventions  Discussed case w Dr Sung Amabile. Will start treatment for presumed MSSA with doxycycline, follow sensitivities. Note that doxy likely will not cover the previously identified UTI/enterococcus since R to tet.    Intervention Category Intermediate Interventions: Infection - evaluation and management  Natasha [name] S. 01/17/2012, 4:02 PM

## 2012-01-18 DIAGNOSIS — E119 Type 2 diabetes mellitus without complications: Secondary | ICD-10-CM

## 2012-01-18 DIAGNOSIS — R4182 Altered mental status, unspecified: Secondary | ICD-10-CM

## 2012-01-18 LAB — CULTURE, RESPIRATORY W GRAM STAIN: Special Requests: NORMAL

## 2012-01-18 MED ORDER — IPRATROPIUM BROMIDE 0.02 % IN SOLN: 0.5000 mg | Freq: Four times a day (QID) | RESPIRATORY_TRACT | Status: AC

## 2012-01-18 MED ORDER — ALBUTEROL SULFATE (5 MG/ML) 0.5% IN NEBU: 2.5000 mg | INHALATION_SOLUTION | Freq: Four times a day (QID) | RESPIRATORY_TRACT | Status: AC

## 2012-01-18 MED ORDER — ALBUTEROL SULFATE (5 MG/ML) 0.5% IN NEBU: 2.5000 mg | INHALATION_SOLUTION | RESPIRATORY_TRACT | Status: AC | PRN

## 2012-01-18 MED ORDER — TRAZODONE HCL 100 MG PO TABS: 100.0000 mg | ORAL_TABLET | Freq: Every evening | ORAL | Status: AC | PRN

## 2012-01-18 MED ORDER — CLONAZEPAM 0.5 MG PO TABS: 1.0000 mg | ORAL_TABLET | Freq: Every evening | ORAL | Status: DC | PRN

## 2012-01-18 MED ORDER — ZIPRASIDONE HCL 80 MG PO CAPS
80.0000 mg | ORAL_CAPSULE | Freq: Every day | ORAL | Status: DC
  Filled 2012-01-18: qty 1

## 2012-01-18 MED ORDER — CLONAZEPAM 1 MG PO TABS: 1.0000 mg | ORAL_TABLET | Freq: Every evening | ORAL | Status: AC | PRN

## 2012-01-18 MED ORDER — ZIPRASIDONE HCL 80 MG PO CAPS: 80.0000 mg | ORAL_CAPSULE | Freq: Every day | ORAL | Status: AC

## 2012-01-18 MED ORDER — DOXYCYCLINE HYCLATE 100 MG PO TABS: 100.0000 mg | ORAL_TABLET | Freq: Two times a day (BID) | ORAL | Status: AC

## 2012-01-18 NOTE — Plan of Care (Signed)
Problem: ICU Phase Progression Outcomes Goal: Voiding-avoid urinary catheter unless indicated Outcome: Completed/Met Date Met:  01/18/12 Pt is incontinent of urine.  She is aware that she is wet but she stated that she has no control of her bladder.

## 2012-01-18 NOTE — Progress Notes (Signed)
Patient was discharged to home per PTAR.  She is alert and oriented.  Harvie Heck, patient's son is at the bedside during this discharge.

## 2012-01-18 NOTE — Progress Notes (Signed)
Speech Language Pathology Dysphagia Treatment Patient Details Name: Natasha [name] MRN: [id number] DOB: March 09, 1960 Today's Date: 01/18/2012 Time: 0908-     Assessment / Plan / Recommendation Clinical Impression  Follow up after swallow assessment yesterday.  Pt. demonstrated independence in utilizing swallow precautions.  No s/s aspiration exhibited with part of breakfast wearing her PMSV.  Recommend continue regular texture and thin liquids.  No follow up tx needed at d/c.  ST signing off.    Diet Recommendation  Continue with Current Diet: Regular;Thin liquid    SLP Plan All goals met      Swallowing Goals  SLP Swallowing Goals Patient will utilize recommended strategies during swallow to increase swallowing safety with: Minimal cueing Swallow Study Goal #2 - Progress: Met  General Temperature Spikes Noted: No Respiratory Status: Trach Behavior/Cognition: Alert;Cooperative Oral Cavity - Dentition: Dentures, top Patient Positioning: Upright in bed  Oral Cavity - Oral Hygiene Does patient have any of the following "at risk" factors?: Oxygen therapy - cannula, mask, simple oxygen devices Brush patient's teeth BID with toothbrush (using toothpaste with fluoride): Yes Patient is AT RISK - Oral Care Protocol followed (see row info): Yes Patient is mechanically ventilated, follow VAP prevention protocol for oral care: Oral care provided every 4 hours   Dysphagia Treatment Treatment focused on: Skilled observation of diet tolerance Treatment Methods/Modalities: Skilled observation Patient observed directly with PO's: Yes Type of PO's observed: Regular;Thin liquids Feeding: Able to feed self Liquids provided via: Cup;Straw   GO     Royce Macadamia M.Ed [employer] [phone]  01/18/2012

## 2012-01-18 NOTE — Progress Notes (Signed)
13:00 Changed trach to #4cfs per md order pt tolerated well.  Spo2 95% good color change on ETCO2.  RT to monitor

## 2012-01-18 NOTE — Progress Notes (Signed)
Inpatient Diabetes Program Recommendations  AACE/ADA: New Consensus Statement on Inpatient Glycemic Control (2013)  Target Ranges:  Prepandial:   less than 140 mg/dL      Peak postprandial:   less than 180 mg/dL (1-2 hours)      Critically ill patients:  140 - 180 mg/dL    Inpatient Diabetes Program Recommendations HgbA1C: =7.6  PLEASE order CBGs achs since patient is on oral antidiabetic Amaryl.   Thank you  Piedad Climes BSN, RN,CDE Inpatient Diabetes Coordinator [phone] (team pager)

## 2012-01-18 NOTE — Progress Notes (Signed)
She is in no distress and is ready for discharge to home. Her medical regimen upon discharge should be exactly what she is currently on in hospital. This includes a decreased dose of Geodon, decreased dose of PRN clonazepam and discontinuation of lisinopril   Billy Fischer, MD ; [facility] service Mobile [phone].  After 5:30 PM or weekends, call [phone]

## 2012-01-18 NOTE — Progress Notes (Signed)
CRITICAL VALUE ALERT  Critical value received:  Respiratory culture 01/14/2012  Date of notification:  01/18/2012  Time of notification:0908  Critical value read back:yes  Nurse who received alert:  Gerald Dexter, RN  MD notified (1st page):  Dr. Sung Amabile  Time of first page:  0908  MD notified (2nd page):  Time of second page:  Responding MD:  Dr. Sung Amabile  Time MD responded:  [phone]

## 2012-01-18 NOTE — Progress Notes (Addendum)
Speech Language Pathology Discharge Patient Details Name: Natasha [name] MRN: [id number] DOB: Jun 01, 1960 Today's Date: 01/18/2012 Time: 4098- 0916    Patient discharged from SLP services secondary to goals met and no further SLP needs identified.  Please see latest therapy progress note for current level of functioning and progress toward goals.    Progress and discharge plan discussed with patient and/or caregiver: Patient/Caregiver agrees with plan  GO     Royce Macadamia 01/18/2012, 9:19 AM

## 2012-01-18 NOTE — Discharge Summary (Signed)
Physician Discharge Summary     Patient ID: Natasha [name] MRN: [id number] DOB/AGE: 1960/03/19 52 y.o.  Admit date: 01/14/2012 Discharge date: 01/18/2012  Admission Diagnoses: Acute on chronic respiratory failure  Acute encephalopathy   Discharge Diagnoses:  Principal Problem:  *Acute encephalopathy Active Problems:  Acute-on-chronic respiratory failure  Shock circulatory  Tracheostomy dependence  COPD, severe  Diabetes, type 2  Anemia of chronic disease  Hypokalemia  Bipolar disorder  Muscular deconditioning   Significant Hospital tests/ studies/ interventions and procedures   History of Present Illness:  52 year old female, recently discharged from [facility] s/p prolonged hospital stay for acute on chronic respiratory failure in setting of AECOPD which required mechanical ventilation. She was chronically trach dependant prior to her most recent hospital stay. She was discharged to home from [facility] 01/10/12. Noted to be in usual State of health w/ no acute complaints as of 1/5. Per husband he had seen her in am hours, left to go to store and on return to home found her with decreased LOC. In ER she was found to be lethargic and hypotensive. She was given narcan bolus w/ intermittent response resulting in agitation. PCCM was asked to admit.   Hospital Course:   1. Acute encephalopathy: Resolved. Presumed due to meds.  2. Acute on chronic hypercarbic respiratory failure: 3. Acute Bronchitis (MRSA tracheobronchitis) 4. Tracheostomy dependant  5.  Shock: resolved, etiology never fully discerned 6. Minimally elevated troponins: nonspecific. No further eval indicated @ this time  7. Hypokalemia: Resolved  8. Hyponatremia  9. Chronic normocytic anemia: no evidence of bleeding. 10 DM 11. Bipolar disease.   Admitted to ICU following the course described above. Ultimately felt Acute exacerbation likely secondary to unintentional  narcotic overdose +/- the influence of purulent bronchitis. She  had her cuffless trach converted in the ER. She was placed on the mechanical ventilator. She was treated with scheduled BDs, empiric antibiotics and given supportive hydration and care. On presentation she was hypotensive. It was unclear as to why as she did not meet SIRS criteria. We checked Dopplers and CT chest which were all negative. Her shock resolved spontaneously, we never truly identified the reason for her hypotension. Her mental status improved, with supportive care and holding sedating meds, she was weaned from the mechanical ventilator. Her sputum did grow MRSA, for which she was started on doxycycline.  At time of discharge she is off the vent. Tolerating PMV well and anxious to go home.   Plan Cont routine trach care Cap trach during day, open at night w/ atc Doxycycline X 7 d  F/u w/ Dr Welton Flakes.    Discharge Exam: BP 108/81  Pulse 117  Temp 98.9 F (37.2 C) (Oral)  Resp 19  Ht 5\' 5"  (1.651 m)  Wt 69.1 kg (152 lb 5.4 oz)  BMI 25.35 kg/m2  SpO2 98% 30% ATC  Physical Examination:  General: NAD, chronic trach present. Awake and alert and interactive.  HEENT: WNL  Cardiac: RRR  Pulm: clear to auscultation bilaterally, no wheezes, rales, or rhonchi  Abd: soft, nontender, nondistended, BS present  Ext: marked muscular wasting. No edema.  Neuro: alert and responding to questions. Moving all 4.    Labs at discharge Lab Results  Component Value Date   CREATININE 0.38* 01/16/2012   BUN 4* 01/16/2012   NA 128* 01/16/2012   K 3.5 01/16/2012   CL 95* 01/16/2012   CO2 25 01/16/2012   Lab Results  Component Value Date   WBC 13.3*  01/16/2012   HGB 9.3* 01/16/2012   HCT 27.5* 01/16/2012   MCV 89.3 01/16/2012   PLT 179 01/16/2012   Lab Results  Component Value Date   ALT 13 01/14/2012   AST 10 01/14/2012   ALKPHOS 109 01/14/2012   BILITOT 0.3 01/14/2012   No results found for this basename: INR,  PROTIME    Current radiology studies No results found.  Disposition:  01-Home or Self Care        Discharge Orders    Future Orders Please Complete By Expires   Diet - low sodium heart healthy      Increase activity slowly          Medication List     As of 01/18/2012  1:23 PM    STOP taking these medications         lisinopril 10 MG tablet   Commonly known as: PRINIVIL,ZESTRIL      TAKE these medications         albuterol (5 MG/ML) 0.5% nebulizer solution   Commonly known as: PROVENTIL   Take 0.5 mLs (2.5 mg total) by nebulization every 6 (six) hours.      albuterol (5 MG/ML) 0.5% nebulizer solution   Commonly known as: PROVENTIL   Take 0.5 mLs (2.5 mg total) by nebulization every 3 (three) hours as needed for wheezing or shortness of breath.      clonazePAM 1 MG tablet   Commonly known as: KLONOPIN   Take 1 tablet (1 mg total) by mouth at bedtime as needed (sleep).      diltiazem 30 MG tablet   Commonly known as: CARDIZEM   Take 30 mg by mouth 3 (three) times daily.      doxycycline 100 MG tablet   Commonly known as: VIBRA-TABS   Take 1 tablet (100 mg total) by mouth 2 (two) times daily.      famotidine 20 MG tablet   Commonly known as: PEPCID   Take 20 mg by mouth 2 (two) times daily.      glimepiride 1 MG tablet   Commonly known as: AMARYL   Take 1 mg by mouth 2 (two) times daily.      ipratropium 0.02 % nebulizer solution   Commonly known as: ATROVENT   Take 2.5 mLs (0.5 mg total) by nebulization every 6 (six) hours.      oxyCODONE 5 MG immediate release tablet   Commonly known as: Oxy IR/ROXICODONE   Take 5 mg by mouth every 6 (six) hours as needed. For pain      traZODone 100 MG tablet   Commonly known as: DESYREL   Take 1 tablet (100 mg total) by mouth at bedtime as needed for sleep.      ziprasidone 80 MG capsule   Commonly known as: GEODON   Take 1 capsule (80 mg total) by mouth at bedtime.         Follow-up Information    Follow up with khan . Schedule an appointment as soon as possible for a visit in 7 days.          Discharged Condition: {good Physician Statement:   The Patient was personally examined, the discharge assessment and plan has been personally reviewed and I agree with ACNP Babcock's assessment and plan. > 30 minutes of time have been dedicated to discharge assessment, planning and discharge instructions.   Signed: BABCOCK,PETE 01/18/2012, 1:23 PM     Agree with above Merwyn Katos, MD

## 2012-01-19 ENCOUNTER — Encounter (HOSPITAL_COMMUNITY): Payer: Self-pay

## 2012-01-19 ENCOUNTER — Emergency Department (HOSPITAL_COMMUNITY)
Admission: EM | Admit: 2012-01-19 | Discharge: 2012-01-19 | Disposition: A | Discharge status: Home / Self Care | Payer: Medicare Other | Attending: Emergency Medicine | Admitting: Emergency Medicine

## 2012-01-19 DIAGNOSIS — J449 Chronic obstructive pulmonary disease, unspecified: Secondary | ICD-10-CM

## 2012-01-19 DIAGNOSIS — F17200 Nicotine dependence, unspecified, uncomplicated: Secondary | ICD-10-CM | POA: Insufficient documentation

## 2012-01-19 DIAGNOSIS — Z430 Encounter for attention to tracheostomy: Secondary | ICD-10-CM | POA: Insufficient documentation

## 2012-01-19 DIAGNOSIS — Z79899 Other long term (current) drug therapy: Secondary | ICD-10-CM | POA: Insufficient documentation

## 2012-01-19 DIAGNOSIS — F319 Bipolar disorder, unspecified: Secondary | ICD-10-CM | POA: Insufficient documentation

## 2012-01-19 DIAGNOSIS — E119 Type 2 diabetes mellitus without complications: Secondary | ICD-10-CM | POA: Insufficient documentation

## 2012-01-19 DIAGNOSIS — J4489 Other specified chronic obstructive pulmonary disease: Secondary | ICD-10-CM | POA: Insufficient documentation

## 2012-01-19 NOTE — ED Notes (Signed)
Spoke with Natasha [name] at [facility] in [place]. She stated that they would have to order her inner cannulas and wondered if we would be able to send her home with enough to get her through until Thursday. The company will be taking her some suction catheters.

## 2012-01-19 NOTE — Progress Notes (Signed)
NCM spoke to pt and offered choice for [facility]. States she will go with [facility] for [facility]. 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  Waiting orders to fax to [facility] for scheduled dc today. Will need HH RN soc for 01/21/2012. [facility] info placed on dc instructions. Pt states she has trach supplies.  Isidoro Donning RN CCM Case Mgmt phone [phone]

## 2012-01-19 NOTE — ED Notes (Addendum)
Per [facility], pt was recently discharged home from here for COPD exacerbation and had had a different trach inserted. The only supplies they have at home are [facility] supplies and that is what they want back. Pt denies any pain but does c/o SOB because she is unable to suction herself with the new trach. Hers at home is a Shiley 4.0.

## 2012-01-19 NOTE — ED Notes (Signed)
Pt made aware that this facility is not able to supply changeable inner cannulas since we have to order them as well and that this RN would speak with the PA about consulting with case management and home health.

## 2012-01-19 NOTE — ED Notes (Signed)
Contacted [employer] in [place] in order to get information regarding ordering supplies for the pt's new trach. Awaiting return phone call.

## 2012-01-19 NOTE — ED Provider Notes (Signed)
History     CSN: [id number]  Arrival date & time 01/19/12  1253   First MD Initiated Contact with Patient 01/19/12 1300      Chief Complaint  Patient presents with  . need different trach inserted     (Consider location/radiation/quality/duration/timing/severity/associated sxs/prior treatment) The history is provided by the patient and medical records.   Natasha [name] is a 52 y.o. female  with a hx of COPD, diabetes and tracheostomy presents to the Emergency Department complaining of inability to use her home cannulas with her new trach onset yesterday after discharge.  Patient states that she had an uncuffed trach which she used a cannula that she has at home. She states that when her trach was replaced yesterday he was replaced with something that does not fit her home cannulas at this time. She states that no one has shown her how to use her trach.. Associated symptoms include inability to suction her own trach.  Nothing makes it better and nothing makes it worse.  Pt denies fever, chills, headache, neck pain, chest pain, shortness of breath, abdominal pain, nausea, vomiting, diarrhea, weakness, dizziness.     Past Medical History  Diagnosis Date  . Mental disorder     bipolar  . COPD (chronic obstructive pulmonary disease)   . Shortness of breath   . Diabetes mellitus without complication     Past Surgical History  Procedure Date  . Tracheostomy     No family history on file.  History  Substance Use Topics  . Smoking status: Current Every Day Smoker  . Smokeless tobacco: Not on file  . Alcohol Use: No    OB History    Grav Para Term Preterm Abortions TAB SAB Ect Mult Living                  Review of Systems  Constitutional: Negative for fever, diaphoresis, appetite change, fatigue and unexpected weight change.  HENT: Negative for mouth sores and neck stiffness.   Eyes: Negative for visual disturbance.  Respiratory: Negative for cough, chest tightness, shortness  of breath and wheezing.   Cardiovascular: Negative for chest pain.  Gastrointestinal: Negative for nausea, vomiting, abdominal pain, diarrhea and constipation.  Genitourinary: Negative for dysuria, urgency, frequency and hematuria.  Skin: Negative for rash.  Neurological: Negative for syncope, light-headedness and headaches.  Psychiatric/Behavioral: Negative for sleep disturbance. The patient is not nervous/anxious.   All other systems reviewed and are negative.    Allergies  Review of patient's allergies indicates no known allergies.  Home Medications   Current Outpatient Rx  Name  Route  Sig  Dispense  Refill  . ALBUTEROL SULFATE (5 MG/ML) 0.5% IN NEBU   Nebulization   Take 0.5 mLs (2.5 mg total) by nebulization every 6 (six) hours.   20 mL   0   . CLONAZEPAM 1 MG PO TABS   Oral   Take 1 tablet (1 mg total) by mouth at bedtime as needed (sleep).   30 tablet      . DILTIAZEM HCL 30 MG PO TABS   Oral   Take 30 mg by mouth 3 (three) times daily.         Marland Kitchen DOXYCYCLINE HYCLATE 100 MG PO TABS   Oral   Take 1 tablet (100 mg total) by mouth 2 (two) times daily.   14 tablet   0   . FAMOTIDINE 20 MG PO TABS   Oral   Take 20 mg by mouth 2 (two)  times daily.         Marland Kitchen GLIMEPIRIDE 1 MG PO TABS   Oral   Take 1 mg by mouth 2 (two) times daily.         . IPRATROPIUM BROMIDE 0.02 % IN SOLN   Nebulization   Take 2.5 mLs (0.5 mg total) by nebulization every 6 (six) hours.   75 mL   0   . OXYCODONE HCL 5 MG PO TABS   Oral   Take 5 mg by mouth every 6 (six) hours as needed. For pain         . TRAZODONE HCL 100 MG PO TABS   Oral   Take 1 tablet (100 mg total) by mouth at bedtime as needed for sleep.         Marland Kitchen ZIPRASIDONE HCL 80 MG PO CAPS   Oral   Take 1 capsule (80 mg total) by mouth at bedtime.         . ALBUTEROL SULFATE (5 MG/ML) 0.5% IN NEBU   Nebulization   Take 0.5 mLs (2.5 mg total) by nebulization every 3 (three) hours as needed for wheezing or  shortness of breath.   20 mL   0     BP 131/86  Pulse 109  Temp 98.8 F (37.1 C) (Oral)  Resp 21  SpO2 94%  Physical Exam  Nursing note and vitals reviewed. Constitutional: She appears well-developed and well-nourished. No distress.  HENT:  Head: Normocephalic and atraumatic.  Mouth/Throat: Oropharynx is clear and moist. No oropharyngeal exudate.  Eyes: Conjunctivae normal are normal. No scleral icterus.  Neck: Normal range of motion. Neck supple.       Trach in place, no signs of infection  Cardiovascular: Regular rhythm, normal heart sounds and intact distal pulses.  Exam reveals no gallop and no friction rub.   No murmur heard. Pulmonary/Chest: Effort normal. No respiratory distress. She has decreased breath sounds. She has no wheezes. She has no rales. She exhibits no tenderness.       Coarse breath sounds throughout  Abdominal: Soft. Bowel sounds are normal. She exhibits no distension and no mass. There is no tenderness. There is no rebound and no guarding.  Musculoskeletal: Normal range of motion. She exhibits no edema.  Neurological: She is alert. She exhibits normal muscle tone. Coordination normal.       Speech is clear and goal oriented Moves extremities without ataxia  Skin: Skin is warm and dry. No rash noted. She is not diaphoretic. No erythema.  Psychiatric: She has a normal mood and affect.    ED Course  Procedures (including critical care time)  Labs Reviewed - No data to display No results found.   1. Tracheostomy care   2. COPD, severe       MDM  Natasha [name] presents for trach replacement.  Discussed this with respiratory therapy he states that this is the only kind of on coughed trach at [place] care his. Discussed with the patient. She states she uses [employer] in [place] for her supplies and that they will not talk to her, the company must talk to the physician.  I will attempt to contact his company to set up delivery of medical  supplies and suctioning equipment.  Respiratory therapy has again discussed with the patient how to use her trach and suctioning.  Patient states she has home help who comes in for 2 hours each day to help her with trach care.  She states she  was to set up a followup appointment with [facility] medical in [place] for 7-10 days to see her pulmonologist.  RN tox with medical supply and they are going to order inserts and will get custom suction for the patient.  We have recontacted respiratory therapy who will again teach her how to use her new trach. We will also supply cannulas until medical supply can order her some.  She remains without complaint here in emergency department.  1. Medications: usual home medications 2. Treatment: rest, drink plenty of fluids,  3. Follow Up: Please followup with your primary doctor for discussion of your diagnoses and further evaluation after today's visit; if you do not have a primary care doctor use the resource guide provided to find one; contact her pulmonologist for followup and discussion of trach care.       Dahlia Client Kathyjo Briere, PA-C 01/19/12 1503

## 2012-01-19 NOTE — Progress Notes (Addendum)
RT was called in room @1329  to suction pt out. RT was then told by pt that we "put the wrong trach in" due to the non-disposable inner cannula whereas pt previously had 4 uncuffed Shiley with disposable inner cannulas. Pt states she only came to the ED today to have trach changed back to old one.  RT has been called into room and explained numerous times with a 4 uncuffed, the only inner cannula we have available is a non-disposable at this facility. Patient was then taught how to remove inner cannula and instructed on how to clean inner cannula. Pt removed several times in front of RT and verbalized understanding.  RN is aware and has called CSW to set up consult to see about getting home health to help with the trach care. RT will continue to monitor.

## 2012-01-19 NOTE — ED Provider Notes (Signed)
Medical screening examination/treatment/procedure(s) were performed by non-physician practitioner and as supervising physician I was immediately available for consultation/collaboration.  Glynn Octave, MD 01/19/12 [phone]

## 2012-01-19 NOTE — ED Notes (Signed)
Hannah, PA at the bedside.  

## 2012-01-20 LAB — CULTURE, BLOOD (ROUTINE X 2)
Culture: NO GROWTH
Culture: NO GROWTH

## 2012-01-20 NOTE — Progress Notes (Signed)
NCM contacted [facility] and spoke to RN scheduler for soc date. They have scheduled date on 1/13 for [facility] RN. Isidoro Donning RN CCM Case Mgmt phone [phone]

## 2012-01-31 ENCOUNTER — Ambulatory Visit: Payer: Self-pay | Admitting: Internal Medicine

## 2012-02-09 ENCOUNTER — Inpatient Hospital Stay: Payer: Self-pay | Admitting: Internal Medicine

## 2012-02-09 LAB — COMPREHENSIVE METABOLIC PANEL
Alkaline Phosphatase: 139 U/L — ABNORMAL HIGH (ref 50–136)
Anion Gap: 2 — ABNORMAL LOW (ref 7–16)
Bilirubin,Total: 0.4 mg/dL (ref 0.2–1.0)
Calcium, Total: 8.6 mg/dL (ref 8.5–10.1)
Creatinine: 0.26 mg/dL — ABNORMAL LOW (ref 0.60–1.30)
EGFR (African American): >60
SGPT (ALT): 17 U/L (ref 12–78)
Sodium: 140 mmol/L (ref 136–145)
Total Protein: 6.8 g/dL (ref 6.4–8.2)

## 2012-02-09 LAB — CBC
HCT: 39.8 % (ref 35.0–47.0)
MCH: 30.6 pg (ref 26.0–34.0)
MCV: 95 fL (ref 80–100)

## 2012-02-09 LAB — CK TOTAL AND CKMB (NOT AT ARMC)
CK, Total: 19 U/L — ABNORMAL LOW (ref 21–215)
CK-MB: <0.5 ng/mL — ABNORMAL LOW (ref 0.5–3.6)

## 2012-02-10 LAB — CBC WITH DIFFERENTIAL/PLATELET
Basophil #: 0.1 10*3/uL (ref 0.0–0.1)
Basophil %: 0.6 %
Eosinophil %: 0 %
HCT: 34 % — ABNORMAL LOW (ref 35.0–47.0)
HGB: 11.1 g/dL — ABNORMAL LOW (ref 12.0–16.0)
Lymphocyte #: 0.5 10*3/uL — ABNORMAL LOW (ref 1.0–3.6)
Lymphocyte %: 5.7 %
MCH: 30.6 pg (ref 26.0–34.0)
MCHC: 32.7 g/dL (ref 32.0–36.0)
Monocyte %: 1.4 %
Neutrophil #: 8.4 10*3/uL — ABNORMAL HIGH (ref 1.4–6.5)
Platelet: 294 10*3/uL (ref 150–440)
RDW: 16.2 % — ABNORMAL HIGH (ref 11.5–14.5)
WBC: 9.2 10*3/uL (ref 3.6–11.0)

## 2012-02-10 LAB — BASIC METABOLIC PANEL
Chloride: 103 mmol/L (ref 98–107)
Co2: 30 mmol/L (ref 21–32)
Creatinine: 0.56 mg/dL — ABNORMAL LOW (ref 0.60–1.30)
EGFR (African American): >60
Glucose: 186 mg/dL — ABNORMAL HIGH (ref 65–99)
Potassium: 4.1 mmol/L (ref 3.5–5.1)
Sodium: 139 mmol/L (ref 136–145)

## 2012-02-10 LAB — CK TOTAL AND CKMB (NOT AT ARMC): CK-MB: <0.5 ng/mL — ABNORMAL LOW (ref 0.5–3.6)

## 2012-03-14 ENCOUNTER — Emergency Department: Payer: Self-pay | Admitting: Emergency Medicine

## 2012-04-08 DEATH — deceased

## 2013-09-27 IMAGING — CR DG CHEST 1V PORT
1 series · 1 of 1 positions shown · non-contrast
Comparison: none

REASON FOR EXAM: cough and fever
COMMENTS:   LMP: Post Hysterectomy

[ap]
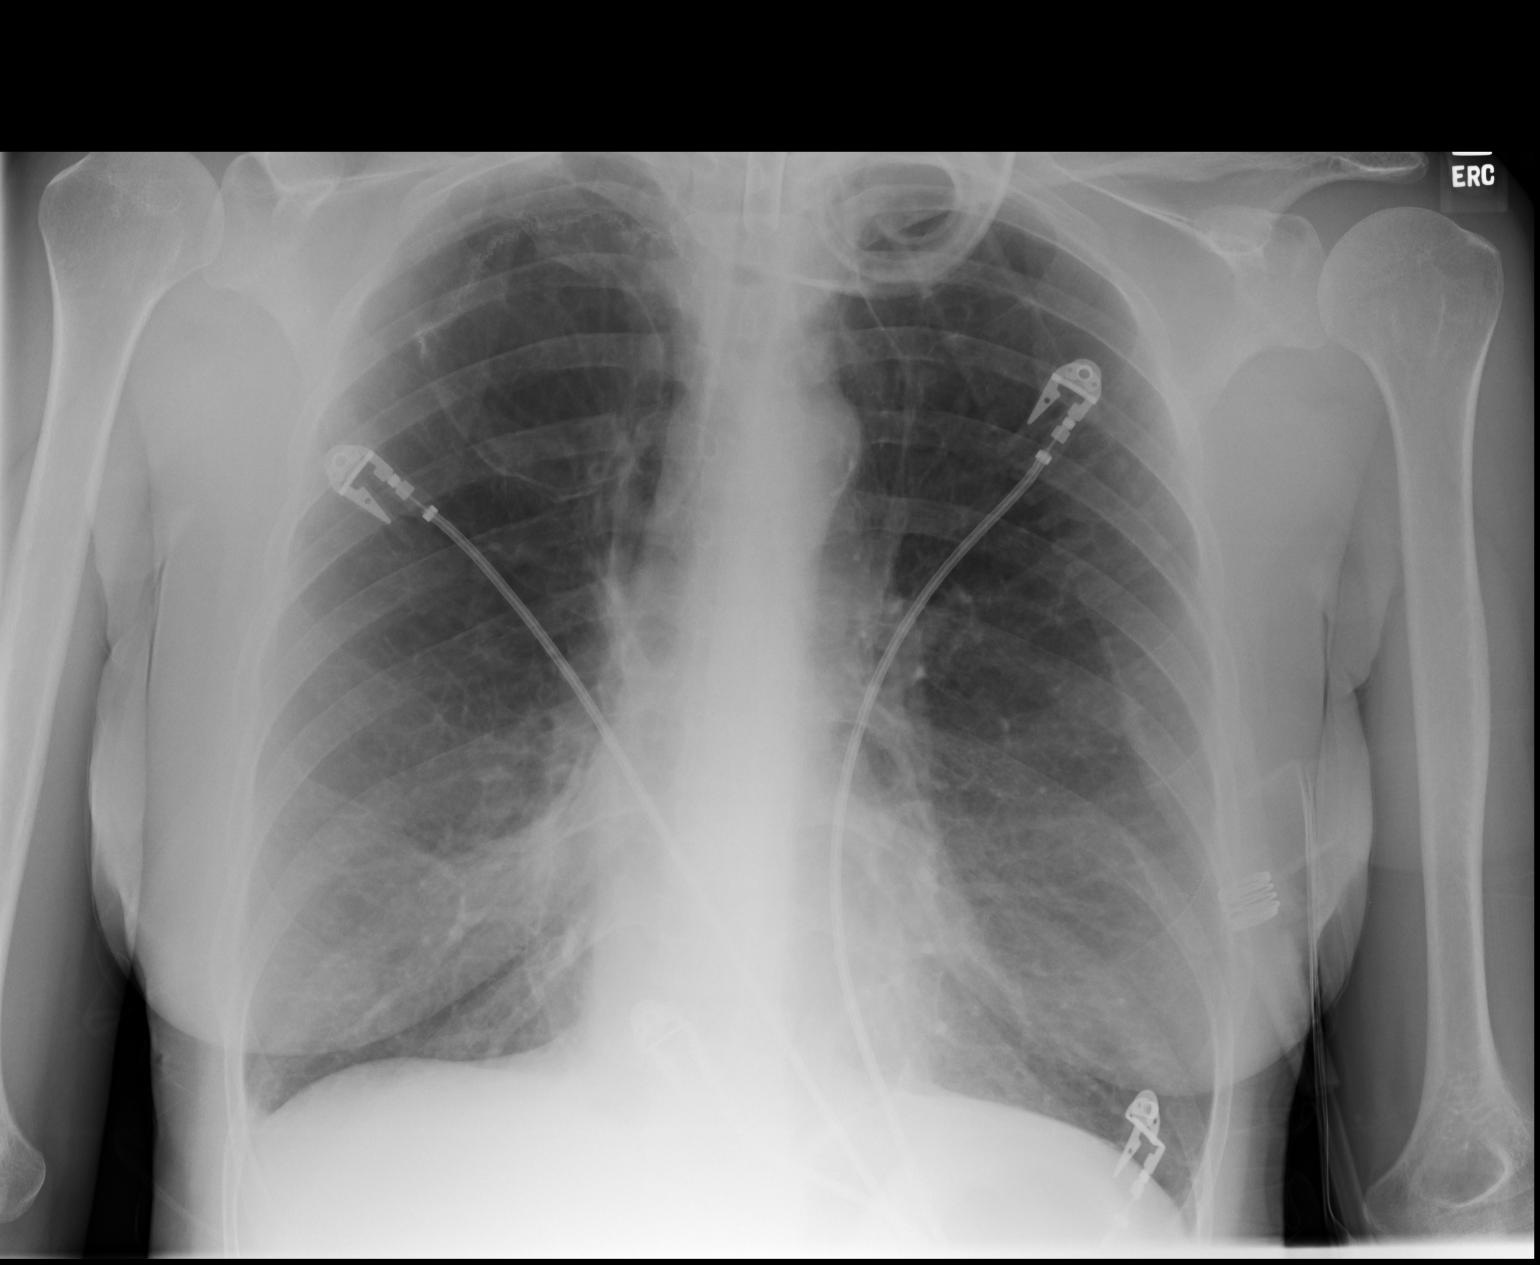

[1 of 1 positions shown; findings below may reference images not displayed]

PROCEDURE:     DXR - DXR PORTABLE CHEST SINGLE VIEW  - August 04, 2011 [DATE]

RESULT:     Comparison made to study 21 February, 2011.

The lungs are hyperinflated. There is a stable ovoid structure in the right
upper lobe associated with surgical suture material which may be reflective
of a bullous lesion. No air-fluid level is seen. There is a tracheostomy
appliance in place. The cardiac silhouette is normal in size. I do not see
abnormal mediastinal soft tissue densities. There is no pleural effusion.
IMPRESSION: 1. There is no evidence of CHF. No focal pneumonia is seen.
2. There is increased density projecting in the right infrahilar region
which may reflect subsegmental atelectasis.
3. In the right upper lobe there are chronic findings that may reflect
previous surgical repair of a bullous lesion. A followup PA and lateral
chest x-ray right chest CT scan would be useful.

## 2013-09-29 IMAGING — CR DG CHEST 1V PORT
1 series · 1 of 1 positions shown · non-contrast
Comparison: none

REASON FOR EXAM: respiratory failure
COMMENTS:

PROCEDURE:     DXR - DXR PORTABLE CHEST SINGLE VIEW  - August 06, 2011  [DATE]
RESULT:     Comparison: 08/04/2011

[portable]
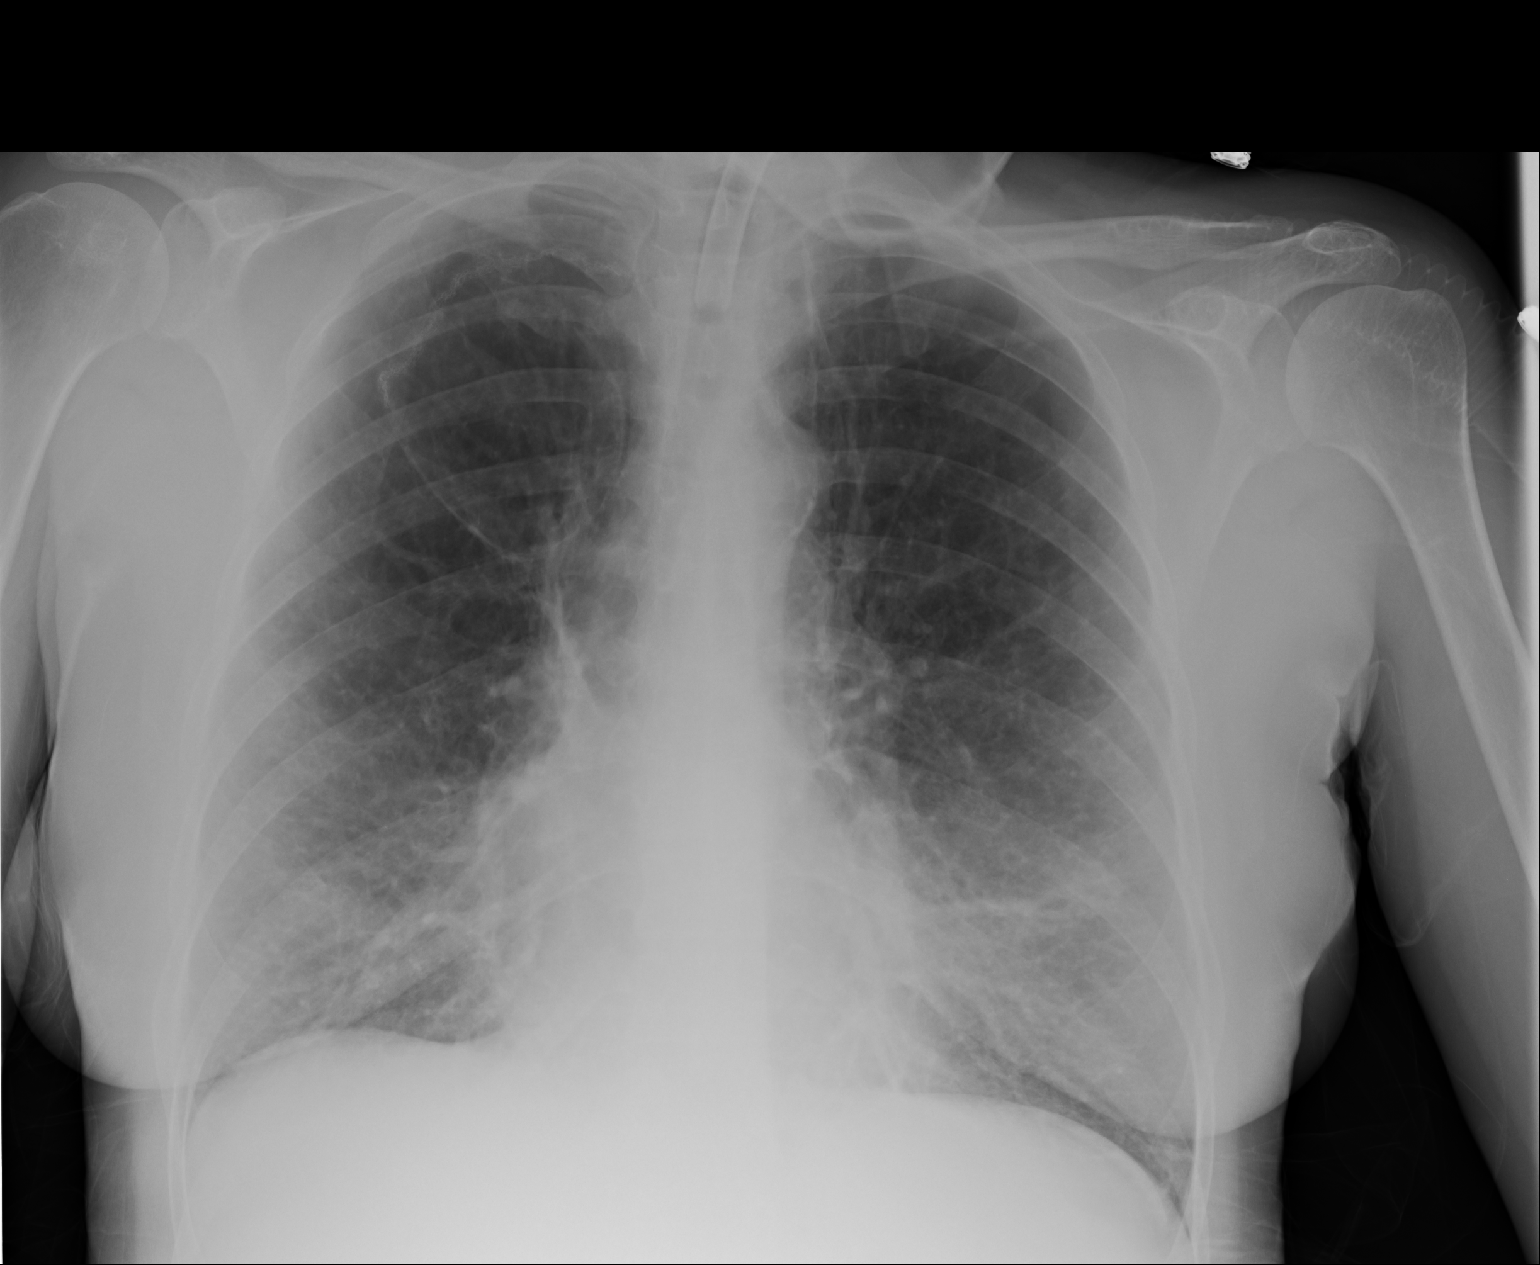

[1 of 1 positions shown; findings below may reference images not displayed]

FINDINGS: Single portable AP chest radiograph is provided. There are increased
interstitial markings at bilateral lung bases likely chronic. There is
evidence of prior right upper lobe surgery. There is no focal parenchymal
opacity, pleural effusion, or pneumothorax. Normal cardiomediastinal
silhouette. The osseous structures are unremarkable.
IMPRESSION: No acute disease of the che[REDACTED]

## 2014-01-01 IMAGING — CR DG CHEST 2V
1 series · 2 of 2 positions shown · non-contrast
Comparison: none

REASON FOR EXAM: cough chest pain
COMMENTS:

PROCEDURE:     KDR - KDXR CHEST PA (OR AP) AND LAT  - November 08, 2011  [DATE]
RESULT:     Comparison: 08/10/2011 and 02/21/2011

[Series 1: pa · 0.17mm/px · 2 of 2 slices shown]
[im 1/2]
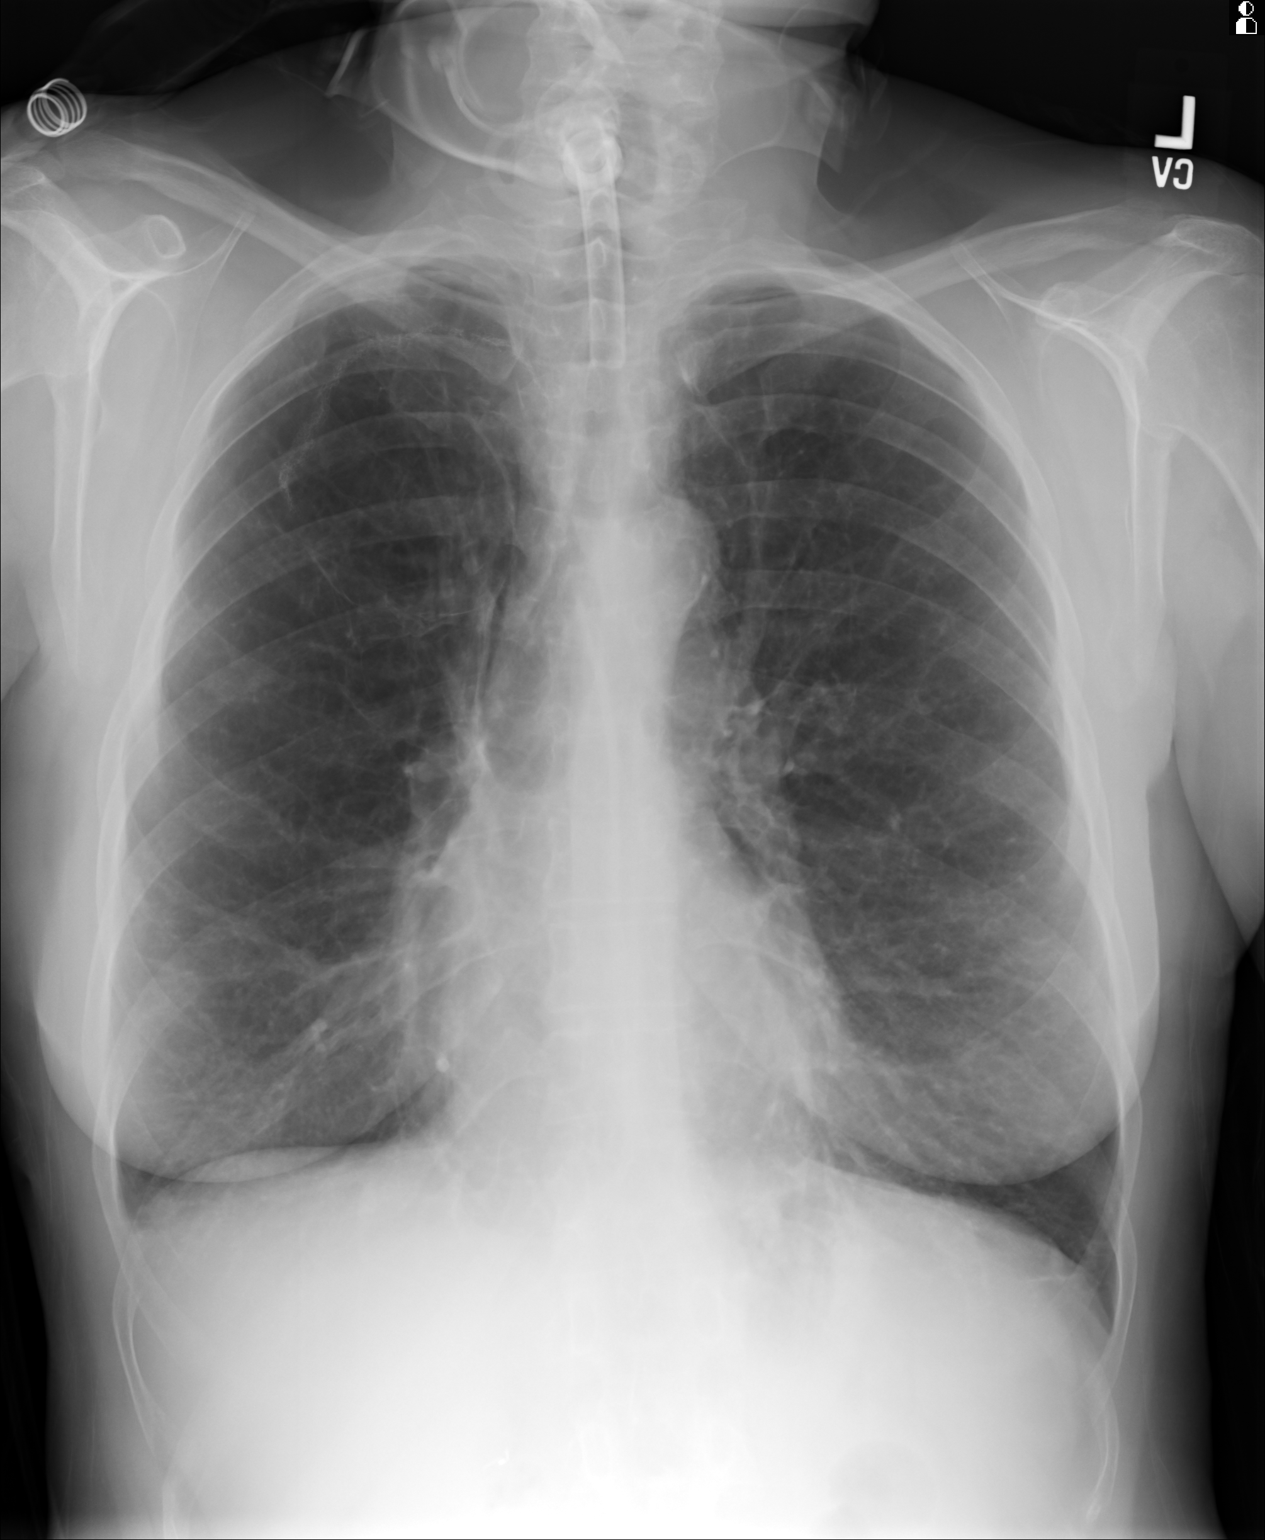
[im 2/2]
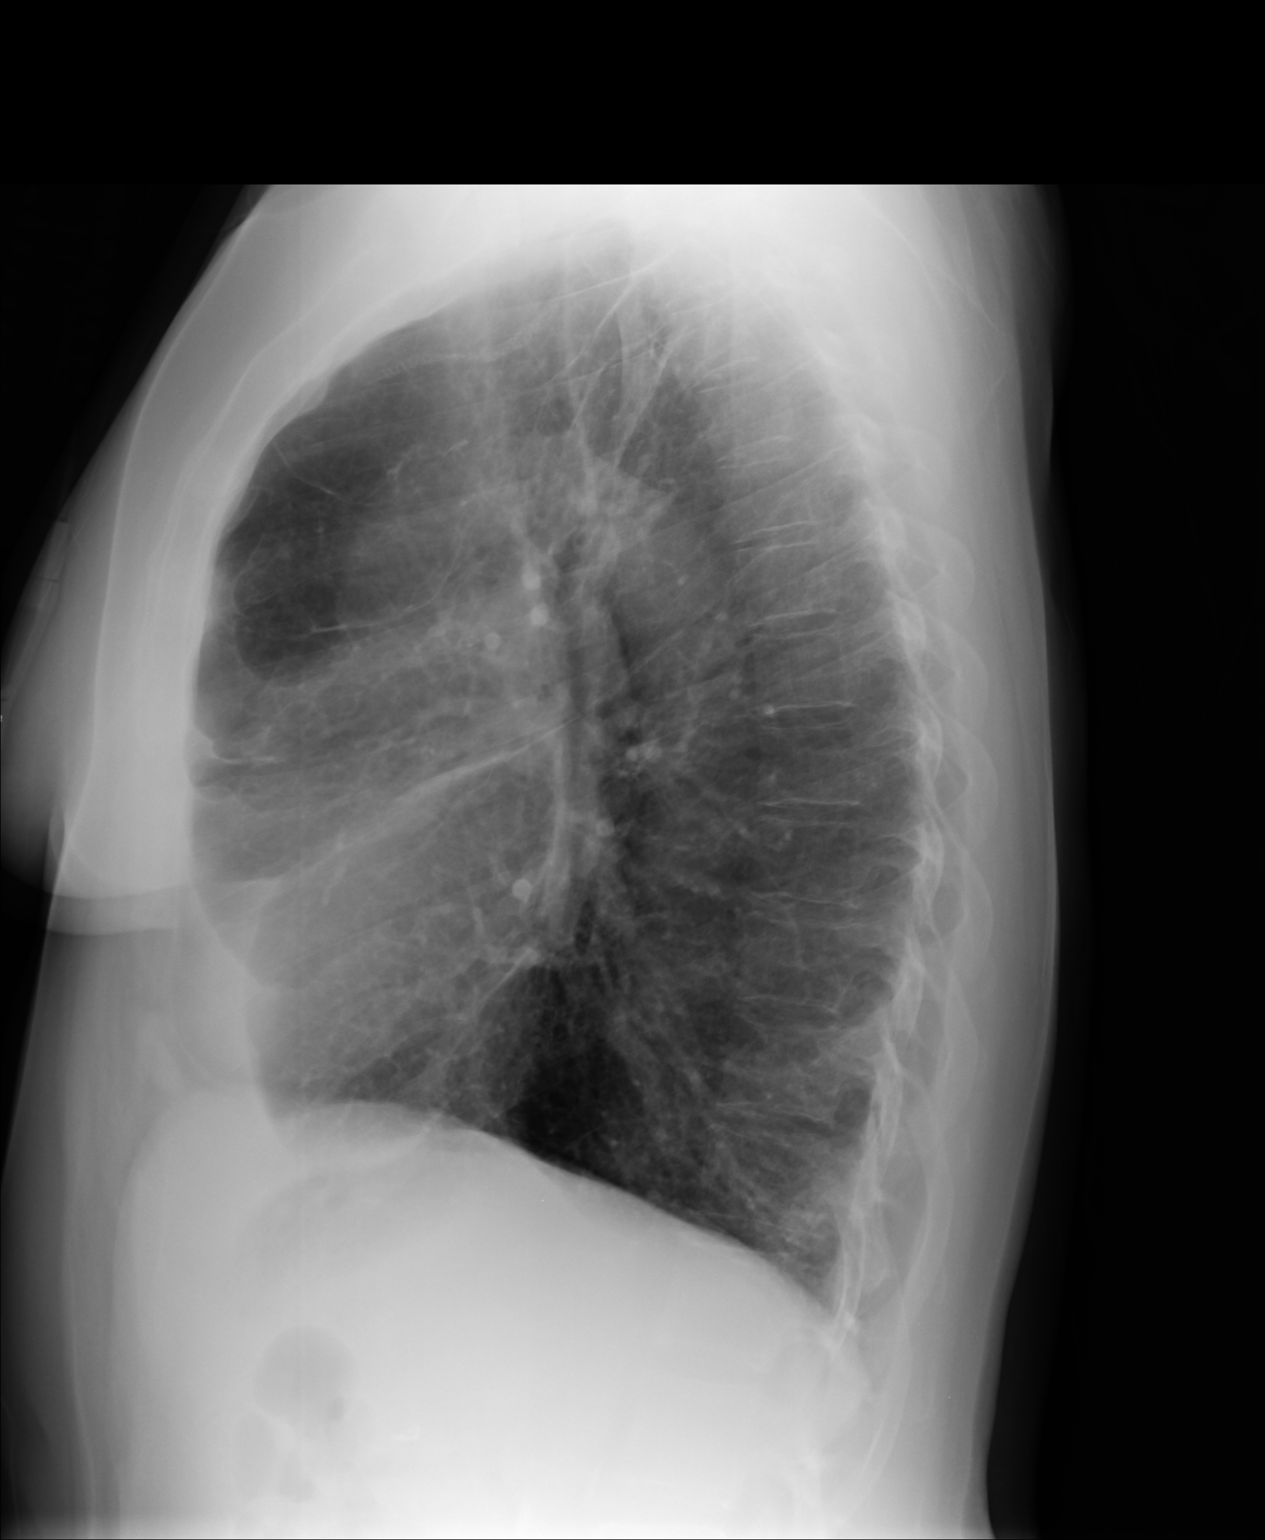

[2 of 2 positions shown; findings below may reference images not displayed]

FINDINGS: The heart and mediastinum are stable. A tracheostomy is present. There are
coarse bilateral reticular opacities which are similar to prior and likely
related to chronic lung disease. A suture line is demonstrated in the right
upper lobe. No new focal pulmonary opacities seen.
IMPRESSION: 1. No acute cardiopulmonary disease.
2. Findings which may be secondary to chronic lung disease, which are
similar to prior.

[REDACTED]

## 2014-02-13 IMAGING — CR DG CHEST 1V PORT
1 series · 1 of 1 positions shown · non-contrast
Comparison: none

REASON FOR EXAM: central line placement
COMMENTS:

[ap]
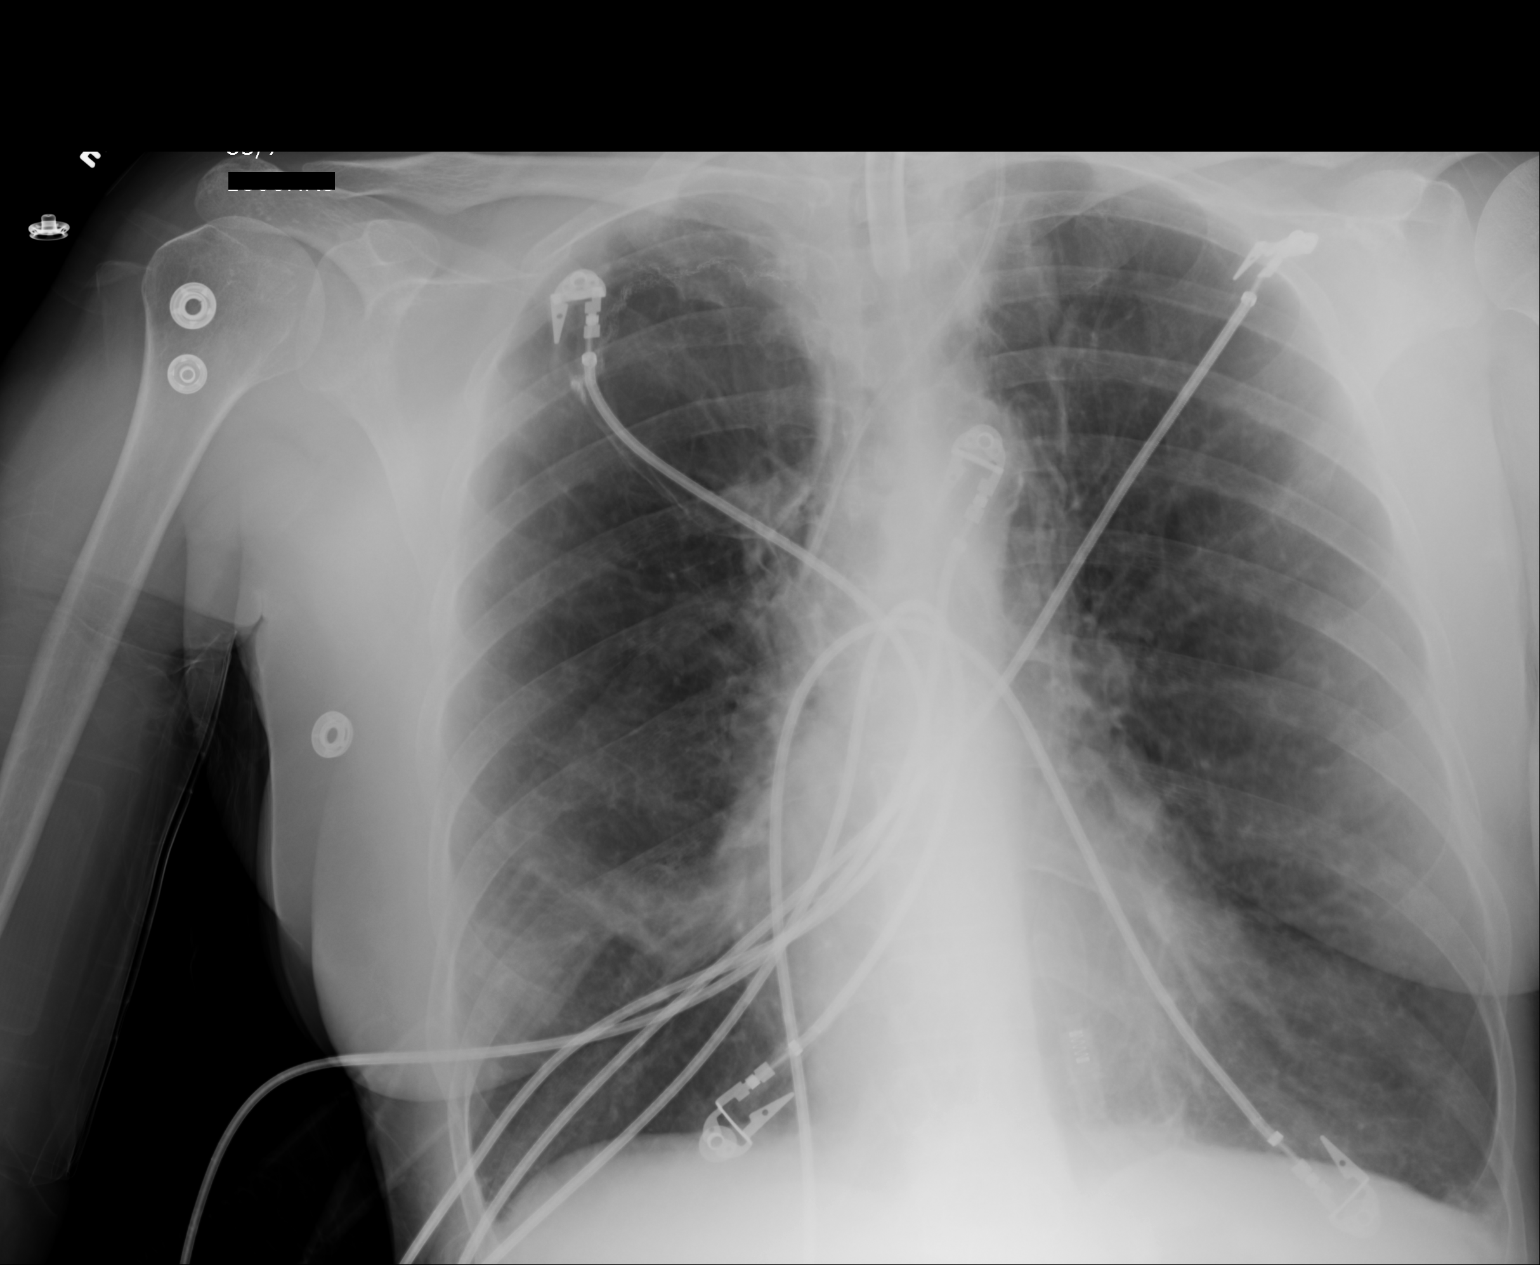

[1 of 1 positions shown; findings below may reference images not displayed]

PROCEDURE:     DXR - DXR PORTABLE CHEST SINGLE VIEW  - December 21, 2011  [DATE]

RESULT:     Comparison is made to the previous exam dated 20 December, 2011.

Tracheostomy tube is present. A left internal jugular central venous
catheters present with the tip in the superior vena cava. The lungs are
hyperinflated consistent with COPD. A trace left pleural effusion may be
present. There is no pneumothorax, interstitial edema or pneumonia evident.
Monitoring electrodes are present. The cardiac silhouette is normal.
IMPRESSION: 1. Superior vena cava central line present. COPD demonstrated. Possible
trace left pleural effusion.

[REDACTED]

## 2014-02-26 IMAGING — CR DG CHEST 1V PORT
1 series · 1 of 1 positions shown · non-contrast
Comparison: Chest x-ray 12/30/2011.

CLINICAL DATA: Pneumonia.  Tracheostomy.

PORTABLE CHEST - 1 VIEW

[AP]
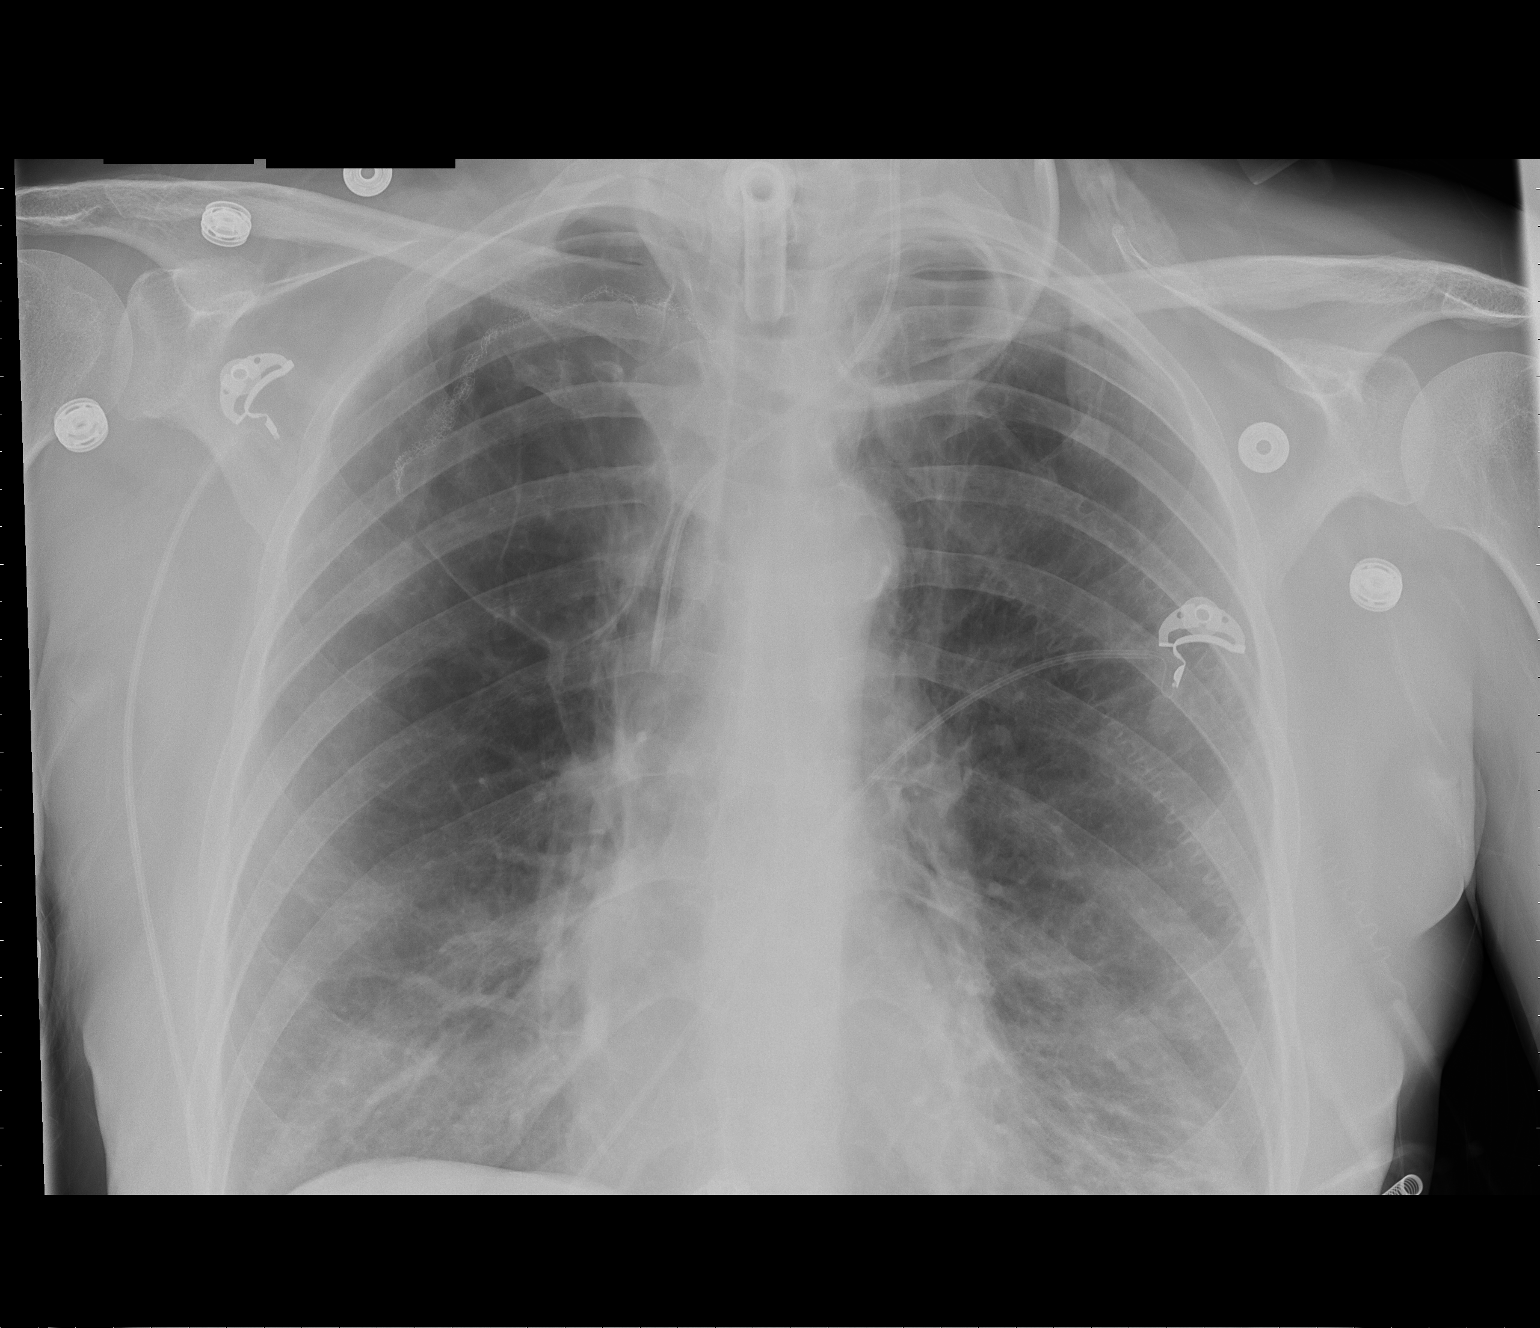

[1 of 1 positions shown; findings below may reference images not displayed]

FINDINGS: A tracheostomy tube is in place with tip 7.8 cm above the
carina. There is a left-sided internal jugular central venous
catheter with tip terminating in the mid superior vena cava. Suture
line related to prior wedge resection in the right apex is
unchanged.  Mild diffuse interstitial prominence and peribronchial
cuffing, most pronounced throughout the mid and lower lungs
bilaterally, slightly increased compared to the prior examination.
In the upper lungs, particularly in the right apex, there are
diminished vascular markings, suggestive of underlying emphysema.
In the right apex in particular, there appears to be a large bulla.
No definite pleural effusions.  No evidence of pulmonary edema.
Heart size is normal.  Mediastinal contours are unremarkable.
Atherosclerosis of the thoracic aorta.
IMPRESSION: 1.  Support apparatus, as above.
2.  Interstitial prominence and peribronchial cuffing throughout
the mid and lower lungs bilaterally is slightly increased compared
to the prior study, and could be concerning for developing
bronchopneumonia.
3.  Atherosclerosis.

## 2014-04-27 NOTE — Consult Note (Signed)
Comments   I met with pt's son, Louie Casa, who is pt's HCPOA. Also present was pt's significant other with whom she lives. They both feel that pt has a good quality of life. Though she is limited by her shortness of breath, she is able to care for herself and to perform all of her own ADL's. Pt continues to smoke 1+ppd. Son is in agreement with current care and with pt's full code status.   Electronic Signatures: Nalaysia Manganiello, Izora Gala (MD)  (Signed 12-Dec-13 20:24)  Authored: Palliative Care   Last Updated: 12-Dec-13 20:24 by Cheri Ayotte, Izora Gala (MD)

## 2014-04-27 NOTE — Consult Note (Signed)
PATIENT NAME:  Natasha [name], Janiyah MR#:  [id number] DATE OF BIRTH:  [birth year]  DATE OF CONSULTATION:  08/07/2011  REFERRING PHYSICIAN:   CONSULTING PHYSICIAN:  Yevonne PaxSaadat A. Khan, MD  REASON FOR CONSULTATION: Acute respiratory failure.   HISTORY OF PRESENT ILLNESS: The patient is a 54 year old white female who is know to me from the office. 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 She has been in the hospital previously with prolonged mechanical ventilation status post tracheostomy and she has been discharged home with tracheostomy. She comes into the hospital because she was having increasing shortness of breath, cough and congestion. She also had a low-grade fever noted at the time. When she was initially evaluated in the Emergency Room was found to be significantly acidotic with a pH  of 7.29, pCO2 of 87. Because of that she was placed on the ventilator. Right now she is on the vent via tracheostomy.  PAST MEDICAL HISTORY: 1. Severe end-stage chronic obstructive pulmonary disease.  2. Ongoing tobacco use.  3. Manic depression.   PAST SURGICAL HISTORY: 1. Cholecystectomy.  2. Tracheostomy. 3. Hysterectomy.   SOCIAL HISTORY: Positive for smoker and she has still been smoking. She denies any drug or alcohol use.   FAMILY HISTORY: Positive for chronic obstructive pulmonary disease.  MEDICATIONS: Reviewed on the electronic medical record.   ALLERGIES: Negative.   REVIEW OF SYSTEMS: She is not able to provide.   PHYSICAL EXAMINATION:  GENERAL: At the time she was evaluated she was on the ventilator via tracheostomy.  VITAL SIGNS: Temperature 98.2, pulse 84, respiratory rate 13, blood pressure 138/99, saturations 93%.   NECK: Neck appeared to be supple. No JVD. No adenopathy. No thyromegaly.   CHEST: Chest showed very coarse breath sounds with some distant rhonchi, overall diminished.  CARDIOVASCULAR: S1, S2 normal. No gallop or  rub.  ABDOMEN: Soft. Bowel sounds present. No rebound or tenderness.   NEUROLOGICAL: Patient was not responsive.   LABORATORY, DIAGNOSTIC AND RADIOLOGICAL DATA: Her latest ABG was 7.35/67/55 which is probably her baseline. Last white count 11.4, hemoglobin 14.8. BUN 6, creatinine 0.6, sodium 140, potassium 3.9. She had a chest x-Shaffer done shows some increased interstitial markings and follow up x-Raudenbush showed some improvement.   IMPRESSION: 1. Acute on chronic respiratory failure. 2. Chronic obstructive pulmonary disease exacerbation. 3. Possible pneumonia.  PLAN: She is on steroids. She will be continued on full ventilatory support. She will also be continued on inhalers and will continue with trying to titrate FiO2. She is extremely end-stage and basically chances of recovering again from repeated insults like this with her ongoing history of tobacco use are poor. Will monitor. Further recommendations as necessary.   ____________________________ Yevonne PaxSaadat A. Khan, MD sak:cms D: 08/07/2011 09:43:48 ET T: 08/07/2011 11:13:22 ET JOB#: [id number]  cc: Yevonne PaxSaadat A. Khan, MD, <Dictator> Yevonne PaxSAADAT A KHAN MD ELECTRONICALLY SIGNED 08/15/2011 11:45

## 2014-04-27 NOTE — Consult Note (Signed)
PATIENT NAME:  Natasha [name], Lanie MR#:  [id number] DATE OF BIRTH:  August 17, 1960  DATE OF CONSULTATION:  08/08/2011  REFERRING PHYSICIAN:  [facility], Dr. Rudene Rearwish, and Oliver HumHeather Hiles, NP CONSULTING PHYSICIAN:  Edward Guthmiller D. Morgaine Kimball, MD  INDICATION: Shortness of breath. Evaluate for possible failure.   HISTORY OF PRESENT ILLNESS: Ms. Rosalia HammersRay is a 54 year old white female with history of endstage chronic obstructive lung disease, oxygen dependent, smoker, status post tracheostomy from previous prolonged intubation and resultant tracheostenosis. The patient also complains that over the previous few days before admission she had a scratchy throat associated with increasing cough and wheezing. Symptoms worsened and there was associated low-grade temp. The patient finally came to the Emergency Room. She has continued smoking issues as well.   REVIEW OF SYSTEMS: No blackout spells or syncope. No nausea or vomiting. No fever, no chills, no sweats. No weight loss. No weight gain. No hemoptysis or hematemesis. No bright red blood rectum. No vision change or hearing change. No sputum production or cough.   PAST MEDICAL HISTORY:  1. Endstage pulmonary disease.  2. Hypoxemia, oxygen dependent. 3. Status post tracheostenosis.  4. Smoking.  5. Manic depressive. 6. Auditory hallucinations.   PAST SURGICAL HISTORY:  1. Tracheostomy.  2. Cholecystectomy.  3. Hysterectomy.  4. Benign ovarian tumor.   FAMILY HISTORY: Obstructive lung disease, heart disease, heart attacks, sudden death.   SOCIAL HISTORY: Chronic smoker, continues to smoke.   MEDICATIONS: 1. Combivent. 2. Geodon 80 twice a day.  3. Clonazepam 1 mg twice a day.  4. Trazodone 100 mg, 3 tablets at bedtime.   ALLERGIES: None.   PHYSICAL EXAMINATION:  VITAL SIGNS: Blood pressure 120/80, pulse of 110, respiratory rate 16.  Sats 91%.   HEENT: Normocephalic, atraumatic. Pupils equal, reactive to light.   NECK:  Tracheostomy.   LUNGS: Clear to  auscultation and percussion. Rhonchi bilaterally.   HEART: Regular rate and rhythm, distant.   ABDOMEN: Benign.   EXTREMITIES: Within normal limits.   NEUROLOGIC: Intact.   SKIN: Normal.   CHEST X-RAYS:  No consolidation or effusion. Possible right lower lobe infiltrate.   EKG: Sinus tachycardia at 110, nonspecific ST-T changes.   LABORATORY DATA: Glucose 120, BUN 6, creatinine 0.6, sodium 140, potassium 3.9. White count 8000, hemoglobin 16, hematocrit 47, platelet count 228.   ASSESSMENT:  1. Possible heart failure.  2. Chronic obstructive pulmonary disease. 3. Respiratory failure.  4. Hypoxemia. 5. Smoking. 6. Tracheostenosis. 7. Manic depression. 8. Possible pneumonia.   PLAN:  Agree with admit. Continue respiratory support.  Agree with Intensive Care Unit care. Follow up cardiac issues, may need echocardiogram. Advised the patient to quit smoking. Consider IV antibiotics. Consider steroid therapy, ENT involvement, as well as critical care pulmonary consult input. Continue inhalers and supplemental oxygen. We will continue to follow the patient. Hopefully we can wean the patient from the vent.      ____________________________ Bobbie Stackwayne D. Juliann Paresallwood, MD ddc:bjt D: 08/11/2011 09:45:51 ET T: 08/11/2011 11:20:45 ET JOB#: [id number]  cc: Vaiden Adames D. Juliann Paresallwood, MD, <Dictator> Alwyn PeaWAYNE D Sana Tessmer MD ELECTRONICALLY SIGNED 08/11/2011 21:13

## 2014-04-27 NOTE — H&P (Signed)
PATIENT NAME:  Natasha [name], Natasha [name] MR#:  [id number] DATE OF BIRTH:  [birth year]  DATE OF ADMISSION:  12/12/2011  PRIMARY PULMONOLOGIST: Dr. Freda Munro  REFERRING ER PHYSICIAN: Dr. Glenetta [name]    CHIEF COMPLAINT: Shortness of breath.   HISTORY OF PRESENT ILLNESS: This is a 54 year old Caucasian female with history of end-stage chronic pulmonary disease on home oxygen, status post tracheostomy and still continues to smoke. She was in the hospital recently for her chronic obstructive pulmonary disease exacerbation and was discharged. She said that last few days she started feeling again short of breath and she also had some secretions coming off her tracheostomy tube. She came to the Emergency Room and was extensive short of breath but respi tech did suction of the tracheostomy and they gave multiple treatment and then finally she had a little improvement in her symptoms so she is being admitted as chronic obstructive pulmonary disease exacerbation, acute on chronic respiratory failure secondary to possible bronchitis.   REVIEW OF SYSTEMS: CONSTITUTIONAL: Positive for fever. Negative for fatigue, weakness, pain, or weight loss. EYES: Denies any blurring, double vision or discharge from the eyes. ENT: Denies any tinnitus, ringing in the ears or discharge. RESPIRATORY: Denies any hemoptysis, but has wheezing and shortness of breath. CARDIOVASCULAR: Denies chest pain, orthopnea, edema, arrhythmia. GASTROINTESTINAL: Denies nausea, vomiting, abdominal pain, or diarrhea. GENITOURINARY: Denies dysuria, increased frequency. ENDOCRINOLOGY: Denies polyuria, nocturia, or heat or cold intolerance. SKIN: Denies any acne or rashes. MUSCULOSKELETAL: Denies any pain or swelling in the joints. NEUROLOGICAL: Denies numbness, weakness, tremors, headache. PSYCHIATRIC: Denies any insomnia or depression. She has anxiety disorder and uses medication for that.   PAST MEDICAL HISTORY:  1. Positive for end-stage chronic obstructive pulmonary  disease on home oxygen status post tracheostomy. 2. Tobacco abuse. 3. Manic depressive disorder.  4. Past history of auditory hallucinations.   PAST SURGICAL HISTORY:  1. Tracheostomy. 2. Cholecystectomy. 3. Hysterectomy. 4. Removal of benign ovarian tumor.    SOCIAL HISTORY: Positive for smoking for many years, continues to smoke. No alcohol drinking, illicit drugs.   FAMILY HISTORY: Positive mother and aunt both had chronic obstructive pulmonary disease. Father died of heart attack.   ALLERGIES: None   HOME MEDICATIONS:  1. Trazodone 100 mg orally once a day. 2. Geodon 80 mg oral 2 times a day. 3. Clonazepam 1 mg oral 2 times a day.  4. Budesonide 0.5 mg inhaled 2 times a day. 5. Albuterol nebulizer as needed.  PHYSICAL EXAMINATION:  VITAL SIGNS: Temperature 98.7, pulse rate 103, respirations 26, blood pressure 135/73 and oxygen saturation 94% on 4 liters oxygen delivery via tracheostomy mask.   GENERAL: She is fully alert, oriented to time, place, and person and cooperative with physical examination and history taking. She has tracheostomy tube present and able to speak while covering the tracheostomy.   HEENT: Head and neck atraumatic. Conjunctivae pink. Oral mucosa moist. Hearing grossly intact.   NECK: Tracheostomy tube present. No JVD. Supple.   CARDIOVASCULAR: S1, S2 present, regular. No murmur appreciated.   RESPIRATORY: Bilateral extensive expiratory wheezing present. Prolonged expiratory phase of breathing.   ABDOMEN: Soft, nontender. Bowel sounds present.   SKIN: No rashes.   LYMPH: No edema.   NEUROLOGICAL: Grossly intact motor and sensory. No abnormality appreciated. No tremor.   PSYCHIATRIC: Does not appear in any gross psychiatry illness at this point of time.   LABORATORY, DIAGNOSTIC AND RADIOLOGICAL DATA: Glucose 96, BUN 3, creatinine 0.34, sodium 138, potassium 4.5, chloride 100, CO2 33, calcium 9.2, total  protein 6.8, albumin 3.4, bilirubin 0.3,  alkaline phosphatase 162, SGOT 25, SGPT 16, WBC 8.5, RBC 4.49, hemoglobin 13.7, platelet count 323, MCV 94. Chest x-Rajagopalan reviewed closely, no infiltrate. Please wait for the official report. EKG tachycardia with T wave inversion in lateral chest leads.   ASSESSMENT AND PLAN: 54 year old female with chronic obstructive pulmonary disease and tracheostomy on home oxygen came with acute exacerbation.  1. Acute on chronic respiratory failure due to acute chronic obstructive pulmonary disease exacerbation due to bronchitis. Will give IV Rocephin and Zithromax. Will give IV Solu-Medrol, DuoNebs, oxygen via trach collar mask. Pulmonary consult with Dr. Freda MunroSaadat [name]. Sputum and blood culture and Spiriva.  2. Anxiety disorder. Will give her clonazepam. 3. DVT and GI prophylaxis.  4. As this is a repeated admission and terminal illness I would like to call palliative care for deciding the future goal of treatment and helping having comfort care. Prognosis extremely poor. 5. Smoking cessation. She is currently a smoker and explained about smoking cessation for five minutes. She is not agreeable to stop smoking at this time but she would like to have nicotine patch while she is in the hospital.    TOTAL TIME SPENT: 55 minutes.   ____________________________ Natasha PigeonVaibhavkumar G. Elisabeth PigeonVachhani, MD vgv:cms D: 12/12/2011 21:53:58 ET T: 12/13/2011 06:28:33 ET  JOB#: [id number] cc: Natasha PigeonVaibhavkumar G. Elisabeth PigeonVachhani, MD, <Dictator> Natasha PaxSaadat A. Khan, MD Natasha DillingVAIBHAVKUMAR Jams Trickett MD ELECTRONICALLY SIGNED 12/24/2011 22:57

## 2014-04-27 NOTE — H&P (Signed)
PATIENT NAME:  Natasha [name], Natasha [name] MR#:  [id number] DATE OF BIRTH:  1960-10-18  DATE OF ADMISSION:  12/19/2011  PRIMARY CARE PHYSICIAN:  Dr. Freda Munro REFERRING PHYSICIAN:  Dr. Mindi Junker   CHIEF COMPLAINT: Shortness of breath and cough for a few days.   HISTORY OF PRESENT ILLNESS:  54 year old Caucasian female with a history of endstage chronic obstructive pulmonary disease on home oxygen, status post tracheostomy, tobacco abuse, and manic-depressive disorder who was sent to the ED from home due to shortness of breath and cough for a few days. The patient is weak but it appears that she does not know the reason she is here. She only complains of  cough, sputum, and shortness of breath for a few days. Actually the patient was just discharged about five days ago due to chronic obstructive pulmonary disease exacerbation. She is a frequent flyer and has been smoking for many years. She said that she still smoked every day after discharge from the hospital. She denies any fever or chills. No headache or dizziness. No chest pain, palpitation, orthopnea, or nocturnal dyspnea. No leg edema. The patient's ABG showed pCO2 80, pO2 31.  She was admitted for acute respiratory failure with chronic obstructive pulmonary disease exacerbation and was treated with nebulizer and oxygen and steroid in the Emergency Department.   PAST MEDICAL HISTORY:  1. Endstage chronic obstructive pulmonary disease on home oxygen status post tracheostomy. 2. Manic-depressive disorder, history of auditory hallucinations.  3. Tobacco abuse.   PAST SURGICAL HISTORY:  1. Tracheostomy.  2. Cholecystectomy.  3. Hysterectomy. 4. Removal of benign ovarian tumor.   SOCIAL HISTORY: Still smoking 1 pack a day. She denies any alcohol drinking or illicit drugs.   FAMILY HISTORY: Mother has chronic obstructive pulmonary disease. Father died of a heart attack.   ALLERGIES: No known drug allergies.   MEDICATIONS:  1. Trazodone 100 mg p.o. at  bedtime. 2. Tiotropium 18-mcg inhalation, one cap inhaled once a day.  3. Prednisone p.o. taper.  4. Geodon 80 mg p.o. b.i.d.  5. Doxycycline 100 mg p.o. b.i.d.  6. Clonazepam 1 mg p.o. b.i.d.  7. Budesonide 0.5 mg per 2 mL, 0.5-mg inhalation twice a day.  8. DuoNebs 4 times a day p.r.n.  9. Albuterol nebulizer p.r.n.   REVIEW OF SYSTEMS:  CONSTITUTIONAL: The patient denies any fever or chills. No headache or dizziness. No weakness. EYES: No double vision or blurred vision. ENT: No epistaxis, postnasal drip, slurred speech, or dysphagia. CARDIOVASCULAR: No chest pain, palpitations, orthopnea, or nocturnal dyspnea. No leg edema. PULMONARY: Positive for cough, sputum, shortness of breath, and wheezing. No hemoptysis. GASTROINTESTINAL: No abdominal pain, nausea, vomiting, or diarrhea. No melena or bloody stool. GU: No dysuria, hematuria, or incontinence. HEMATOLOGIC: No easy bruising or bleeding. ENDOCRINE: No polyuria, polydipsia, or heat or cold intolerance. NEUROLOGIC: No syncope, loss of consciousness, or seizure.   PHYSICAL EXAMINATION:  VITAL SIGNS: Temperature 98.4, blood pressure 161/106, pulse 118, oxygen saturation 86% on oxygen by nasal cannula.   GENERAL: The patient is an alert, awake, oriented, in no acute distress on oxygen via trach.   HEENT: Pupils round, equal, reactive to light and accommodation. Dry oral mucosa. Clear oropharynx.   NECK: Supple. No JVD or carotid bruits. No lymphadenopathy. No thyromegaly.  CARDIOVASCULAR: S1 and S2, regular rate, rhythm. No murmurs or gallops.   PULMONARY: Bilateral air entry.  Bilateral severe wheezing and mild crackles. No rales. Mild use of accessory muscles to breathe.   ABDOMEN: Soft, obese. Bowel sounds  present. No distention or tenderness. No organomegaly.   EXTREMITIES: No edema, clubbing, or cyanosis. No calf tenderness. Bilateral pedal pulses present.   SKIN: No rash or jaundice.   NEURO: Alert and oriented times three. No  focal deficit. Power five out of five. Sensation intact.   LABORATORY, DIAGNOSTIC, AND RADIOLOGICAL DATA: ABG showed pH of 7.36, pCO2 80, pO2 31, FiO2 21. Glucose 175, BUN 6, creatinine 0.52, sodium 136, potassium 3.4,  chloride 94, bicarbonate 38, CBC normal. INR 0.8. Troponin less than 0.02. EKG shows sinus tachycardia at 116.   IMPRESSION:  1. Acute on chronic respiratory failure.  2. Chronic obstructive pulmonary disease exacerbation.  3. Tachycardia.  4. Hypokalemia.  5. Tobacco abuse.   PLAN OF TREATMENT:  1. The patient will be admitted to the medical floor. We will continue oxygen by trach, give Zithromax, Solu-Medrol IVPB, and give nebulizers p.r.n.  2. For hypokalemia we will give potassium and follow up BMP and magnesium level.  3. For tobacco abuse, smoking cessation was counseled for five minutes and we will give nicotine patch daily.  4. GI and deep vein thrombosis prophylaxis.   Discussed the patient's situation and plan of treatment with the patient.         TIME SPENT: About 55 minutes.    ____________________________ Shaune PollackQing Rahn Lacuesta, MD qc:bjt D: 12/19/2011 16:49:28 ET T: 12/19/2011 17:32:51 ET JOB#: [id number]  cc: Shaune PollackQing Broc Caspers, MD, <Dictator> Yevonne PaxSaadat A. Khan, MD Shaune PollackQING Kaiyan Luczak MD ELECTRONICALLY SIGNED 12/22/2011 12:59

## 2014-04-27 NOTE — H&P (Signed)
PATIENT NAME:  Natasha [name], Natasha [name] MR#:  [id number] DATE OF BIRTH:  January 12, 1960  DATE OF ADMISSION:  08/05/2011  PRIMARY CARE PHYSICIAN: Oliver Hum, NP    CHIEF COMPLAINT: Shortness of breath, wheezing, cough, low-grade temperature.   HISTORY OF PRESENT ILLNESS: Ms. Natasha [name] is a 54 year old Caucasian female with a history of end-stage chronic obstructive pulmonary disease, home oxygen-dependent, and ongoing tobacco abuse.  She is status post tracheostomy from previous prolonged intubation and resultant tracheal stenosis.  The patient reports that two days ago she developed a scratchy throat associated with increasing cough and wheezing.  Her symptoms worsened, and there was associated low-grade fever reaching 100.1. The patient came to the Emergency Department  for further evaluation and treatment.   REVIEW OF SYSTEMS: CONSTITUTIONAL: She reports low-grade fever 100.1, a few chills. No night sweats. EYES: No blurring of vision, no double vision.  ENT: No hearing impairment, no sore throat, no dysphagia, but she had mild scratchy throat two days ago.  CARDIOVASCULAR: No chest pain, only after coughing after a while her chest will be sore. She reports wheezing and shortness of breath. RESPIRATORY: Shortness of breath and wheezing and dry cough. No hemoptysis. GASTROINTESTINAL: No abdominal pain, no vomiting, no diarrhea. GENITOURINARY: No dysuria, no frequency of urination. MUSCULOSKELETAL: No joint pain or swelling.  No muscular pain or swelling.  INTEGUMENTARY: No skin rashes, no ulcers. NEUROLOGICAL:   No focal weakness, no seizure activity, no headache. PSYCHIATRY: History of anxiety and depression. ENDOCRINE: No polyuria or polydipsia.  No heat or cold intolerance.    PAST MEDICAL HISTORY:  1. End-stage chronic obstructive pulmonary disease, home oxygen dependent, status post tracheostomy after a prolonged intubation and development of tracheal stenosis.  2. Tobacco abuse.  3. Manic depressive disorder.   4. Past history of auditory hallucinations.    PAST SURGICAL HISTORY:  1. Tracheostomy.  2. Cholecystectomy.  3. Hysterectomy.  4. Removal of benign ovarian tumor at the age of 18.   SOCIAL HABITS: Chronic smoker for many years. She continues to smoke. She tells me she is smoking only 2 cigarettes a day. No alcoholism or other drug abuse.   FAMILY HISTORY: Her mother and her aunt both suffered from chronic obstructive pulmonary disease. Her father died from a heart attack, the age is unknown; and the patient says that he did not have anything to do with her. She did not know him well.   SOCIAL HISTORY: She is unemployed, lives on Disability. She lives at home with her boyfriend.   ADMISSION MEDICATIONS:  1. Combivent inhaler p.r.n.   2. Geodon 80 mg. This is supposed to be twice a day or three times a day, but she tells [place] she takes three tablets at night.  3. Clonazepam 1 mg b.i.d., but she takes two tablets at night.  Earlier she told me she is taking 5 mg, two tablets at night.  4. Trazodone 100 mg tablet, two to three tablets at bedtime.   ALLERGIES: No known drug allergies.   PHYSICAL EXAMINATION:  VITAL SIGNS: Her blood pressure is 115/85, respiratory rate 18, pulse 109, temperature 99.5, oxygen saturation 91% while she is on oxygen.   GENERAL: A middle-aged female lying in bed in sitting position receiving breathing treatment.  She is coughing every now and then. She is in no acute distress.   HEENT: Head: No pallor, no icterus, no cyanosis. ENT: Hearing was normal. Nasal mucosa, lips, tongue were normal.  She has dentures for upper jaw. Eyes: Examination revealed  normal iris and conjunctivae, although there is subtle ptosis in the right eye compared to the left. Pupils are about 5 mm, equal and reactive to light.   NECK: Supple, trachea at midline.  There is a tracheostomy site, and she is receiving breathing treatment and oxygen. No cervical masses.   HEART: Normal S1, S2,  no S3, no S4. No murmur, no gallop. No carotid bruits.   RESPIRATORY: Examination revealed slight tachypnea, although this is variable between 18 per minute to 22 per minute.  No evidence of stridor. There is a prolonged expiratory phase and a few scattered wheezes. Generally, there are decreased breathing sounds bilaterally. She is also using some of her accessory muscles.   ABDOMEN: Soft without tenderness.  No hepatosplenomegaly, no masses, no hernias.   SKIN: Examination revealed no ulcers, no subcutaneous nodules.   MUSCULOSKELETAL: No joint swelling, no clubbing.   NEUROLOGICAL:  Cranial nerves II through XII were intact.  No focal motor deficit.   PSYCHIATRIC: The patient is alert and oriented x3.  Mood and affect were flat.   LABORATORY, DIAGNOSTIC AND RADIOLOGICAL DATA: Chest x-Cerveny showed no consolidation, no effusion.  There is a rounded shadow of the right upper lung zone, but this is unchanged compared to her old chest x-Schiro. EKG shows sinus tachycardia at a rate of 111 per minute, unremarkable EKG.  Serum glucose 119, BUN 6, creatinine 0.6, sodium 140, potassium 3.9. CBC showed a white count of 8000, hemoglobin 16, hematocrit 47, platelet count 228.   ASSESSMENT:  1. Chronic obstructive pulmonary disease, acute exacerbation.  2. Chronic respiratory failure, home oxygen-dependent.  3. Tobacco abuse.  4. The patient is status post tracheostomy with history of tracheal stenosis.  5. Manic depressive disorder.   PLAN:  1. We will admit the patient to the medical floor and intensify treatment with Duo-Neb every 4 hours, IV Solu-Medrol, intravenous antibiotics using Levaquin.   2. The patient has to quit smoking.  She was counseled for that.  3. We will continue oxygen supplementation.   4. Continue home medications as above.     TIME SPENT:   Time spent in evaluating this patient took more than 55 minutes.   ____________________________ Carney CornersAmir M. Rudene Rearwish,  MD amd:cbb D: 08/05/2011 02:54:08 ET T: 08/05/2011 10:17:38 ET JOB#: [id number]  cc: Carney CornersAmir M. Rudene Rearwish, MD, <Dictator> Oliver HumHeather Hiles, NP Karolee OhsAMIR Dala DockM Jeanpierre Thebeau MD ELECTRONICALLY SIGNED 08/16/2011 22:18

## 2014-04-27 NOTE — H&P (Signed)
PATIENT NAME:  Natasha [name], Remona MR#:  [id number] DATE OF BIRTH:  [birth year]  DATE OF ADMISSION:  11/19/2011  PRIMARY CARE PHYSICIAN: Dr. Freda MunroSaadat Khan  REFERRING PHYSICIAN: Dr. Lucrezia EuropeAllison Webster  CHIEF COMPLAINT: Shortness of breath for two weeks.   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 Patient is lethargic, unable to provide any information. According to ED physician patient has had shortness of breath for two weeks and just accommodated with steroids but still has shortness of breath, cough, yellow sputum. Patient is lethargic. ABG showed pH 7.26, pCO2 73. Patient was treated with Solu-Medrol nebulizer and placed on ventilator just now.    PAST MEDICAL HISTORY: As mentioned above:  1. End-stage chronic obstructive pulmonary disease on home oxygen status post tracheostomy.  2. Tobacco abuse.  3. Manic depressive disorder.  4. Past history of auditory hallucinations.   PAST SURGICAL HISTORY:  1. Tracheostomy. 2. Cholecystectomy. 3. Hysterectomy. 4. Removal of benign ovarian tumor at age of 54.   SOCIAL HISTORY: Chronic smoker for many years, continues to smoke. No alcohol drinking or illicit drugs.   FAMILY HISTORY: According to previous document patient's mother and aunt both suffer from chronic obstructive pulmonary disease. Father died of heart attack.   REVIEW OF SYSTEMS: Unable to obtain at this time due to patient's mental status.   ALLERGIES: None.   MEDICATIONS:  1. Trazodone 100 mg p.o. 2 to 3 tablets every night. 2. Klor-Con 20 mEq p.o. for two days.  3. Ibuprofen 800 mg p.o. t.i.d.  4. Geodon 80 mg p.o. b.i.d.  5. Clonazepam 1 mg 1 tab p.o. b.i.d.  6. Budesonide 0.05 mg inhalation twice a day.  7. Albuterol  nebulizer.   PHYSICAL EXAMINATION:  VITAL SIGNS: Temperature 97.9, blood pressure 115/69, pulse 84, respirations 28, oxygen saturation 95% on oxygen.   GENERAL: Patient is lethargic, use of accessory muscles to breathe, unable to provide any information or review of systems.   HEENT: Pupils round, equal, reactive to light. Moist oral mucosa.   NECK: Supple. Status post tracheostomy. No JVD or carotid bruit.   CARDIOVASCULAR: S1, S2 regular rate, rhythm. No murmurs, gallops.   PULMONARY: Bilateral air entry. Severe bilateral expiratory wheezing. No crackles but has rhonchi with use of accessory muscles to breathe.   ABDOMEN: Soft. No distention. No organomegaly. Bowel sounds present.   EXTREMITIES: No edema, clubbing, or cyanosis. No calf tenderness. Strong bilateral pedal pulses.   SKIN: No rash or jaundice.   NEUROLOGIC: Patient is lethargic, unable to examine at this time.   LABORATORY, DIAGNOSTIC AND RADIOLOGICAL DATA: ABG showed pH 7.26, pCO2 73, pO2 81 with FiO2 40%. Troponin less than 0.02. WBC 13.8, hemoglobin 16, platelets 237, glucose 120, BUN 11, creatinine 0.64, sodium 141, potassium 3.4, bicarbonate 35. Chest x-Iglesias: No obvious infiltrate, chronic obstructive pulmonary disease changes. EKG showed normal sinus rhythm at 82 beats per minute with prolonged QT.   IMPRESSION:  1. Acute on chronic respiratory failure.  2. Chronic obstructive pulmonary disease exacerbation.  3. Altered mental status.  4. Leukocytosis due to acute respiratory failure.  5. Leukocytosis.  6. Hypokalemia.  7. Tobacco abuse.   PLAN OF TREATMENT:  1. Patient will be admitted to Critical Care Unit. Will continue ventilation. Give Solu-Medrol IVPB. Will start Levaquin, Xopenex. In addition, we will get  pulmonary consult from Dr. Welton Flakes.  2. We will follow up CBC and blood culture.  3. We will give potassium supplement and follow up BMP and magnesium level.  4. GI and deep vein thrombosis prophylaxis.    TIME SPENT: About 65 minutes.   ____________________________ Shaune Pollack, MD qc:cms D: 11/19/2011 08:17:16 ET T: 11/19/2011 09:01:06 ET JOB#: [id number]  cc: Shaune Pollack, MD, <Dictator> Yevonne Pax, MD Shaune Pollack MD ELECTRONICALLY SIGNED 11/20/2011 16:40

## 2014-04-27 NOTE — Discharge Summary (Signed)
PATIENT NAME:  Natasha [name], Natasha [name] MR#:  [id number] DATE OF BIRTH:  [birth year]  DATE OF ADMISSION:  08/05/2011 DATE OF DISCHARGE:  08/10/2011  ADDENDUM: She will likely have central line placed because her peripheral lines are all outdated at this time. Central line will be placed by Dr. Erin FullingKurian [name] and this will be dictated at a later time. Echocardiogram results will be dictated at a later time. ____________________________ Katharina Caperima Griff Badley, MD rv:slb D: 08/10/2011 08:38:51 ET T: 08/10/2011 09:08:07 ET JOB#: [id number]  cc: Katharina Caperima Wilberth Damon, MD, <Dictator> Darral Rishel MD ELECTRONICALLY SIGNED 08/13/2011 1:15

## 2014-04-27 NOTE — Discharge Summary (Signed)
PATIENT NAMArvella [name]:  Natasha [name] MR#:  [id number] DATE OF BIRTH:  Aug 21, 1960  DATE OF ADMISSION:  12/19/2011 DATE OF DISCHARGE:  12/26/2011  DISCHARGE DIAGNOSES: 1. Acute on chronic respiratory failure and chronic obstructive pulmonary disease exacerbation. 2. History of end-stage chronic obstructive pulmonary disease, oxygen dependent through trach. 3. History of bipolar depression.   CODE STATUS: DO NOT RESUSCITATE.   CONSULTATIONS: 1. Erin FullingKurian [name], MD - Critical Care and Pulmonary. 2. Ned GraceNancy [name], MD - Palliative Care.  PRESENT MEDICATIONS: 1. Precedex drip for sedation. 2. Propofol drip. 3. Insulin drip. 4. IV fluids with normal saline at 10 mL/h. 5. Tylox 1 to 2 tablets q. 4 hours p.r.n. for pain. 6. Lovenox 40 mg subcutaneous daily. 7. Morphine 2 to 4 mg q. 1 hour p.r.n. for pain and trouble breathing. 8. Klonopin 1 mg via NG tube twice a day. 9. Combivent inhaler via trach 8 puffs q. 4 hours.  10. Pepcid 20 mg q. 12 hours. 11. Flovent 220 mcg inhalation 2 puffs every 12 hours via trach.  12. Multivitamin via NG tube daily.  13. Senna as needed for constipation.  14. Ziprasidone 80 mg via NG tube twice a day.  15. Trazodone 100 mg at bedtime.  16. Solu-Medrol 60 mg IV every 6 hours. 17. Tube feedings, Jevity, 35 mL via NG tube every hour with water flushes, 25 mL each time.  18. Pro-Stat 1 pack every day, 4 times.   LAB DATA: Troponin is less than 0.02. WBC on admission 10.9, hemoglobin 14.2, hematocrit 43.2 and platelets 266. Electrolytes on admission: Sodium 136, potassium 3.4, chloride 94, bicarbonate 38, BUN 6, creatinine 0.52 and glucose 175. Magnesium 1.9. Hemoglobin A1c 6.8.  RADIOLOGIC DATA: Chest x-[name] showed no evidence of focal consolidation.   Chest x-[name] this morning showed stable appearance and trach is present. Coarse lung markings in the left base. No focal pneumonia.   DIAGNOSTICS: ABG on admission: pH 7.36, pCO2 80, pO2 less than 31 and FiO2 21%.  Repeat  blood gas on 12/20/2011 showed ABG with pH of 7.21, pCO2 elevated at 115 and pO2 154.   ABG this morning: pH 7.41, pCO2 62, pO2 85 and FiO2 40%. She is on assist control mode 500 with PEEP of 5 and rate of 16 and FiO2 of 38%.   VITALS: This morning the patient's heart rate is 96, respirations 20, blood pressure 113/62 and saturation 96%.   HOSPITAL COURSE:  1. This is a 54 year old female patient with end-stage COPD and multiple hospitalizations here who has a chronic trach and she is on home oxygen via trach. She came in on 12/19/2011 because of shortness of breath and cough. The patient was admitted for acute on chronic respiratory failure and COPD exacerbation and started on nebulizers and steroids. The patient initially was admitted to the floor. She was also started on Zithromax. However, the patient's condition deteriorated and she had trouble breathing. ABG looked worse with respiratory acidosis. She was transferred to the ICU and switched back to vent support. The patient was started on vent support from the 12th. Since then she is on full vent support. Tried to wean her 2 times, but she did not tolerate with increased work of breathing. The patient needs slow weaning, so the patient is referred to [facility] for slow weaning from the vent and she will be going to [facility] most likely today when the bed is available. The patient will need slow tapering of steroids and continue nebulizers. Right now white count is  slightly high, but she has no evidence of fever, no secretions. The patient's leukocytosis is likely due to steroids. She finished 5 days of Zithromax, 500 mg daily, so she is not on any IV antibiotics. She was also seen by palliative care, Dr. [name], and the patient wanted to be a FULL CODE so she is a FULL CODE at this time.  2. She has history of bipolar disorder. She is on Geodon and Klonopin. That can be continued via NG tube.  3. Hypertension. The patient initially was hypotensive and that  got better with fluids. Later on she developed hypertension with systolic blood pressure up to 161/09, so she was started on hydralazine and Norvasc, but the patient's blood pressure again started to go down so we stopped the Norvasc and hydralazine. Right now she is not on any blood pressure medications, but blood pressure is stable at 113/62. She is not on pressors, so at this time she will be just monitored closely to see if she develops any hypertension or hypotension.  4. Nutrition. She is getting NG tube feedings with Jevity. She is tolerating them. The patient's condition is stable, but her prognosis is very poor. The patient's quality of life will be very poor due to severe end-stage COPD with ongoing tobacco abuse.  TOTAL TIME SPENT ON DISCHARGE PREPARATION: More than 30 minutes She will go to [facility] when the bed is available.  ____________________________ Katha Hamming, MD sk:sb D: 12/26/2011 09:14:08 ET T: 12/26/2011 12:37:07 ET JOB#: [id number]  cc: Katha Hamming, MD, <Dictator> Katha Hamming MD ELECTRONICALLY SIGNED 12/27/2011 23:55

## 2014-04-27 NOTE — Discharge Summary (Signed)
PATIENT NAME:  Natasha [name], Natasha [name] MR#:  [id number] DATE OF BIRTH:  1960-01-29  DATE OF ADMISSION:  08/05/2011 DATE OF DISCHARGE:  08/10/2011  ADMITTING DIAGNOSIS: Chronic obstructive pulmonary disease exacerbation.   DISCHARGE DIAGNOSES:  1. Acute on chronic respiratory failure.  2. Chronic obstructive pulmonary disease exacerbation.  3. Bronchitis.  4. Hypotension after intubation. 5. Mild dehydration, resolved on intravenous fluids.  6. Elevated troponin with chest pain, likely demand ischemia.  7. Ongoing tobacco abuse.  8. Leukocytosis, resolved on antibiotic therapy.  9. History of chronic respiratory failure. 10. History of bipolar disorder. 11. Status post tracheostomy for prolonged intubation and development of tracheal stenosis in the past.  12. History of auditory hallucinations. 13. Tracheostomy. 14. Cholecystectomy.  15. Hysterectomy.  16. Benign ovarian tumor, removed at age of 67.    DISCHARGE CONDITION: Guarded.   DISCHARGE MEDICATIONS: When the patient is discharged, the patient is to resume her outpatient medications which include the following. 1. Combivent inhaler as needed.  2. Geodon 80 mg p.o. twice daily. 3. Clonazepam 1 mg p.o. twice daily.  4. Trazodone 100 mg two to three tablets at bedtime   INPATIENT MEDICATIONS: Medications in the hospital and then that is what the patient was on prior to discharge are as follows.  1. Propofol drip as needed. 2. Dextrose 5% 20 mL an hour with propofol drip.  3. Sodium chloride 100 mL/hour. 4. Tylenol 325 mg tablet 650 mg orally every 4 hours as needed for elevated temperature or pain. 5. Tussionex  suspension 5 mL twice daily as needed.  6. Heparin injection 5000 units subcutaneously every eight hours.  7. Zofran 4 mg IV every four hours.  8. Trazodone 200 mg p.o. at bedtime.  9. Lorazepam (Ativan) 1 to 2 mg IV every 3 hours as needed.  10. Morphine 2 to 4 mg IV every two hours as needed.  11. Zosyn 3.375 grams IV  every eight hours.  12. Albuterol ipratropium oral inhaler 8 puff inhalation every four hours.  13. Chlorhexidine 0.12% liquid in 5 mL orally twice daily.  14. Fluticasone HFA 220 mcg inhaler 2 puffs twice daily.  15. Levaquin 500 mg IV every 24 hours.  16. Aspirin 81 mg orally daily. 17. Protonix 40 mg IV daily.  18. Vancomycin 1 gram every eight hours.  19. Metoprolol 12.5 mg via OG-tube twice daily.  20. Lipitor 40 mg via OG-tube daily.   DIET: The patient is nothing by mouth. She has an OG tube. She is started on diet with Jevity at 1.5 calorie STH 15 mL an hour continuously with 25 mL of water flushes once an hour. Check residual and document ins and outs on flow sheets every 4 hours. Hold tube feeds if residuals are more than 250 mL. Recheck every two hours and then restart when residuals are less than 100 mL. This is to correlate with current rate of Diprivan; according to medical records is 4 mg/kg per hour.  CONSULTANTS:  1. Dorothyann Peng, MD - Cardiology. 2. Freda Munro, MD - Pulmonary.   RADIOLOGIC STUDIES: Chest x-Purdum portable single view on 08/04/2011 showed no evidence of congestive heart failure, no focal pneumonia. There is increased density projecting in the right infrahilar region which may reflect subsegmental atelectasis. In the right upper lobe, there are chronic findings which may reflect previous surgical repair of bullous lesion. Follow-up PA and lateral chest x-Sebastiano or CT scan would be useful, according to the radiologist.   Chest portable single on 08/06/2011 showed  no acute disease of chest.  Repeated portable chest x-Schissler on 08/07/2011 showed slight interval improvement in appearance of interstitium in lower lobes of both lungs.   Chest portable single view on 08/08/2011 showed no significant interval change in appearance of the chest from earlier study, according to the radiologist.   HISTORY OF PRESENT ILLNESS: The patient is a 54 year old Caucasian female with  past medical history significant for history of chronic obstructive pulmonary disease, ongoing tobacco abuse, and chronic respiratory failure who presented to the hospital with complaints of shortness of breath, wheezing, and cough as well as low grade fever. Please refer to Dr. Windell Norfolk admission note from 08/05/2011.   On arrival to the hospital, the patient's blood pressure was 115/85, respiratory rate 18, pulse 109, temperature 99.5, and oxygen saturation was 91% while she was on oxygen. Physical exam revealed tachypnea with rate 18 to 22, no evidence of stridor, prolonged expiratory phase and a few scattered wheezes. She had decreased breath sounds bilaterally. She was also using some accessory muscles.   RESULTS: Labs on arrival to the hospital, labs showed a glucose of 119, otherwise BMP was unremarkable. CBC: White blood cell count was 8.4, hemoglobin level 16.1, and platelet count 228.   Blood cultures x1 was taken which showed no growth.  EKG showed sinus tachycardia at rate of 111, otherwise normal EKG. No significant ST-T changes.   HOSPITAL COURSE: The patient was admitted to the hospital and started on antibiotics with Levaquin as well as steroids and inhalation therapy. With this therapy she improved the next day, however, she shortly decompensated and became more and more somnolent. Repeated ABG was done on 08/06/2011 and showed acidosis, respiratory acidosis with hypercarbia. The patient's pH was 7.29, pCO2 87, and pO2 78 on 40% FiO2 with oxygen saturation 93.9%. The patient's bicarbonate level was high at 41.8 and lactic acid level was 1.9. The patient was admitted to the critical care unit. She was initially started on BiPAP. However, she deteriorated and was intubated and now remains on assist control mode. She is being continued on Levaquin. However, because of her deterioration, other antibiotics such as Zosyn as well as vancomycin were added and sputum cultures were obtained.  Sputum cultures up until now are not available. However, just now two minutes ago, sputum cultures came back and they show Achromobacter denitrificans, moderate growth, sensitive to ceftazidime, imipenem, trimethoprim sulfamethoxazole as well as Levaquin. Overall with this antibiotic therapy, the patient's condition somewhat improved on the ventilator and she was wheezing, however a few days ago with inhalation therapy and careful bronchial and lung care, the patient's condition improved. The patient's G-tube inner cannula was changed yesterday, on 08/09/2011, and she was felt to be improving in regards to her lung function. She does not have much wheezing anymore and her air entrance seems to be much better. Her lungs seem to be opening much better. Her last ABG was done on 08/08/2011 and at that time the patient's pH was 7.39, pCO2 60, pO2 57, and saturation was 80.9% on 55% FiO2, on tidal volume of 450, PEEP of 5.0, assist control mode, mechanical rate of 14, and FiO2 of 35. The patient was continued on this current rate and because of her history of cardiorespiratory failure and concerns that her weaning will be prolonged, she will be going to [facility] for care of her lung condition. We likely need to discontinue her vancomycin as well as Zosyn now and keep her just on Levaquin as her  sputum cultures came back sensitive to Levaquin alone.   After intubation, on 08/07/2011, the patient became somewhat hypotensive. She woke briefly on the vent and she told physicians that her chest was somewhat hurting. Cardiac enzymes were checked and at that time troponin was noted to be mildly elevated at 0.36, on 08/07/2011, with MB fraction of 3.7 and CK total of 34. On the second set, the patient's MB fraction was within normal limits; however, troponin was even lower at 0.22. On the third set, the patient's CK as well as MB fractions were completely within normal limits and troponin was even lower at 0.13. It was  felt that the patient could have had demand ischemia during this period of time and she was started on aspirin as well as Lipitor; however, the patient was not able to be started on nitroglycerin or metoprolol due to hypotension. The patient is to be started metoprolol upon discharge as the patient's blood pressure now has improved.               The patient is being discharged in stable condition with the above-mentioned medications and follow-up. Her temperature on the day of discharge is 98, pulse 60 to 70, respiration rate 15 to 16, blood pressure 128/75, and saturation 88% to 89% on vent.   TIME SPENT: 40 minutes.  ____________________________ Katharina Caperima Stan Cantave, MD rv:slb D: 08/10/2011 08:37:15 ET T: 08/10/2011 09:10:05 ET JOB#: [id number]  cc: Katharina Caperima Makita Blow, MD, <Dictator> Aadvika Konen MD ELECTRONICALLY SIGNED 08/13/2011 1:15

## 2014-04-27 NOTE — Discharge Summary (Signed)
PATIENT NAMArvella [name]:  Natasha [name] MR#:  [id number] DATE OF BIRTH:  April 21, 1960  DATE OF ADMISSION:  11/19/2011 DATE OF DISCHARGE:  11/25/2011  PRESENTING COMPLAINT: Shortness of breath for two weeks.   DISCHARGE DIAGNOSES:  1. Acute on chronic hypoxic respiratory failure secondary to chronic obstructive pulmonary disease flare.  2. Chronic tracheostomy.  3. Ongoing tobacco abuse.   CONDITION ON DISCHARGE: Fair.   CODE STATUS: FULL CODE.   MEDICATIONS:  1. Albuterol nebulizer.  2. Geodon 80 mg b.i.d.  3. Clonazepam 1 mg twice a day.  4. Budesonide 0.5 mg inhalation twice a day.  5. Klor-Con 20 mEq daily for two more days.  6. Prednisone taper.  7. Trazodone 100 mg at bedtime.   DIET: Mechanical soft, thin liquid diet. No straws. Meds in puree.   FOLLOW-UP: [facility]adat [facility], in 1 to 2 weeks.    CONSULTATION: Pulmonary with Dr. Belia HemanKasa    LABS AT DISCHARGE: White count 13.1, hemoglobin and hematocrit 14.8 and 45.6, platelet count 214. Chemistries within normal limits except glucose of 171. Magnesium 2.3. Phosphorus 3.6.   Chest x-Roan hyperinflation consistent with bullous emphysema. Tracheostomy appliance tip lies at the level of the inferior margin of the clavicular heads.   Blood cultures negative in five days.   BRIEF SUMMARY OF HOSPITAL COURSE: Natasha NighDeborah [name] is a 54 year old Caucasian female with history of chronic respiratory failure status post tracheostomy with trach collar who came in with:  1. Acute on chronic respiratory failure due to COPD exacerbation and possible pneumonia. The patient completed antibiotic with Zosyn and Zithromax. There was no obvious pneumonia noted on the chest x-Dolecki. She was placed on the vent on admission and slowly weaned off as her sats improved and her breathing improved. She was weaned off the IV Solu-Medrol and is to complete the prednisone taper at home. The patient uses home 3.5 liters oxygen. Pulmonary consultation  was made with Dr. Belia HemanKasa. Overall the patient remained clinically stable and is being discharged to home.  2. Metabolic encephalopathy due to COPD, improved. She was alert and oriented at discharge.  3. Hypokalemia, replaced.  4. Depression, on trazodone and Geodon.  5. Hyperglycemia with no history of diabetes, likely due to steroids.  6. Tobacco use disorder. The patient was counseled on cessation.  7. PT recommended rehab, however, the patient refused. She wanted to go home with personal aide services. She also refused home health.   Hospital stay otherwise remained stable.   TIME SPENT: 40 minutes.   ____________________________ Wylie HailSona A. Allena KatzPatel, MD sap:drc D: 11/26/2011 07:17:15 ET T: 11/26/2011 11:27:31 ET JOB#: [id number]  cc: Kriston Pasquarello A. Allena KatzPatel, MD, <Dictator> Yevonne PaxSaadat A. Khan, MD Willow OraSONA A Payslie Mccaig MD ELECTRONICALLY SIGNED 11/29/2011 20:11

## 2014-04-30 NOTE — Discharge Summary (Signed)
PATIENT NAMArvella [name]:  Bruemmer, [name] MR#:  [id number] DATE OF BIRTH:  Jun 09, 1960  DATE OF ADMISSION:  02/09/2012 DATE OF DISCHARGE:  02/10/2012  PRIMARY CARE PHYSICIAN: Natasha MunroSaadat Khan, MD  DISCHARGE DIAGNOSES: 1. Acute chronic obstructive pulmonary disease exacerbation.  2. Acute on chronic respiratory failure.  3. Tobacco abuse.   IMAGING STUDIES: Chest x-Knauer showed no acute abnormalities.   ADMITTING HISTORY AND PHYSICAL: Please see detailed H and P dictated on 01/09/2012. In brief, this is 54 year old Caucasian female patient with a trach and on 4 liters oxygen at home who presented to the Emergency Room complaining of worsening shortness of breath. The patient was found to be in hypercapnic respiratory failure needing more oxygen and was admitted to the hospitalist service for further workup and treatment.   HOSPITAL COURSE: Acute COPD exacerbation with acute on chronic respiratory failure: The patient improved well over 24 hours of hospital stay with around the clock nebulizers, steroids, antibiotics and oxygen support. The patient was on 4 to 6 liters of oxygen in the morning, wheezing. The patient was advised to stay another 24 hours for stabilization and further treatment, but the patient insisted on being discharged home. The patient was discharged home with caution and advised to contact her doctor or return to the Emergency Room if there is any deterioration. The patient presently has improved significantly and is close to baseline.   On the day of discharge, the patient's temperature is 97.3, pulse of 88, blood pressure 109/73 and saturating 95% on trach with 5 liters oxygen.   DISCHARGE MEDICATIONS: 1. Symbicort 2 puffs inhaled twice a day.  2. Albuterol 2 puffs oral every 4 hours. 3. Pro Stat 101 one packet. 4. Fluticasone 220 mcg inhaled twice a day.  5. Albuterol nebulizer every 6 hours.  6. Clonazepam 1 mg 2 tablets oral once a day.  7. Diazepam 30 mg oral 3 times a day.  8. Famotidine  20 mg oral 2 times a day. 9. Glimepiride 1 mg oral 2 times a day. 10. Fluconazole 150 mg oral for 3 days.  11. Ipratropium 500 mcg nebulizer every 6 hours.  12. Lisinopril 10 mg oral once a day.  13. Oxycodone 5 mg oral every 6 hours as needed for pain.  14. MiraLAX once a day as needed for constipation.  15. Trazodone 100 mg oral 2 tablets once a day at bedtime.  16. Ziprasidone 80 mg 3 capsules oral once a day at bedtime.  17. Prednisone 60 mg tapered over 12 days.  18. Levaquin 750 mg oral once a day for 5 days.   DISCHARGE INSTRUCTIONS: The patient will be on diabetic diet, activity as tolerated. Continue oxygen to keep saturations above 92% at 6 liters with her trach. The patient will follow up with her pulmonologist and primary care physician in 1 to 2 weeks. She has been advised to stay in the hospital for another 24 hours, but the patient insisted on going home and will be discharged home with prescriptions.   TIME SPENT: On the day of discharge in discharge activity was 45 minutes.  ____________________________ Molinda BailiffSrikar R. Luster Hechler, MD srs:sb D: 02/10/2012 14:04:50 ET T: 02/11/2012 07:54:58 ET JOB#: [id number]  cc: Wardell HeathSrikar R. Elpidio AnisSudini, MD, <Dictator> Yevonne PaxSaadat A. Khan, MD Orie FishermanSRIKAR R Keshaun Dubey MD ELECTRONICALLY SIGNED 02/14/2012 13:18
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
## 2014-04-30 NOTE — Discharge Summary (Signed)
PATIENT NAMArvella Nigh:  [name], Natasha [name] MR#:  [id number] DATE OF BIRTH:  03/24/1960  DATE OF ADMISSION:  12/12/2011 DATE OF DISCHARGE:  12/14/2011  DISCHARGE DIAGNOSES:  1. Acute on chronic respiratory failure due to acute chronic obstructive pulmonary disease exacerbation and bronchitis. 2. Depression. 3. Smoking.  4. Auditory hallucination.  5. Manic depressive disorder.   DISPOSITION: The patient is being discharged home.   FOLLOW-UP: Primary care physician, Dr. Freda MunroSaadat Khan, in 1 to 2 weeks after discharge.   DIET: Regular.   ACTIVITY: As tolerated.   DISCHARGE MEDICATIONS:  1. Geodon 80 mg b.i.d.  2. Klonopin 1 mg b.i.d.  3. Trazodone 100 milligrams daily.  4. Budesonide nebulizers b.i.d. 5. Prednisone taper.  6. DuoNebs every four hours p.r.n.  7. Doxycycline 500 mg 1 tablet twice a day for five days.   RESULTS: Chest x-[place] showed no acute abnormalities. Sputum culture normal flora. Blood cultures negative. Urine drug screen negative. Complete metabolic panel essentially normal. CBC normal.   HOSPITAL COURSE: The patient is a 54 year old female with past medical history of chronic respiratory failure due to end-stage chronic obstructive pulmonary disease, status post trach, ongoing smoking, manic depressive disorder, auditory hallucination, who presented with shortness of breath. The patient continues to smoke. She was found to have acute on chronic respiratory failure due to acute chronic obstructive pulmonary disease exacerbation and bronchitis. She was started on empiric antibiotics, nebulizer treatments and steroids with good improvement in her symptoms. Blood cultures and sputum cultures were sent and have been normal so far. The patient was significantly improved by the time of discharge. She has been extensively counseled about smoking cessation. She is being discharged home in a stable condition.   TIME SPENT: 45 minutes.    ____________________________ Darrick MeigsSangeeta Hans Rusher,  MD sp:ap D: 12/14/2011 14:27:51 ET T: 12/14/2011 15:29:27 ET JOB#: [id number]  cc: Darrick MeigsSangeeta Jissell Trafton, MD, <Dictator> Yevonne PaxSaadat A. Khan, MD Darrick MeigsSANGEETA Chlora Mcbain MD ELECTRONICALLY SIGNED 01/17/2012 21:14

## 2021-02-06 ENCOUNTER — Telehealth: Payer: Self-pay

## 2021-02-06 NOTE — Telephone Encounter (Signed)
CRITICAL VALUE STICKER  CRITICAL VALUE: Glucose = [phone]  RECEIVER (on-site recipient of call): Yetta Glassman, [place] NOTIFIED: 02/06/21 at 3:27pm  MESSENGER (representative from lab): Pam  MD NOTIFIED: Barton/Feng  TIME OF NOTIFICATION: 02/06/21 at 3:30am  RESPONSE: Notification provided to Robinson, Makena for follow-up with pt and provider.
